# Patient Record
Sex: Male | Born: 1960 | Race: White | Hispanic: No | Marital: Married | Smoking: Current every day smoker
Health system: Southern US, Community
[De-identification: ages and names within clinical notes are randomized; demographics above are authoritative.]

## PROBLEM LIST (undated history)

## (undated) DIAGNOSIS — E669 Obesity, unspecified: Secondary | ICD-10-CM

## (undated) DIAGNOSIS — I509 Heart failure, unspecified: Secondary | ICD-10-CM

## (undated) DIAGNOSIS — I10 Essential (primary) hypertension: Secondary | ICD-10-CM

## (undated) DIAGNOSIS — E119 Type 2 diabetes mellitus without complications: Secondary | ICD-10-CM

## (undated) DIAGNOSIS — I4891 Unspecified atrial fibrillation: Secondary | ICD-10-CM

## (undated) DIAGNOSIS — B1920 Unspecified viral hepatitis C without hepatic coma: Secondary | ICD-10-CM

## (undated) DIAGNOSIS — N529 Male erectile dysfunction, unspecified: Secondary | ICD-10-CM

## (undated) DIAGNOSIS — I2510 Atherosclerotic heart disease of native coronary artery without angina pectoris: Secondary | ICD-10-CM

## (undated) HISTORY — DX: Unspecified atrial fibrillation: I48.91

## (undated) HISTORY — DX: Obesity, unspecified: E66.9

## (undated) HISTORY — DX: Heart failure, unspecified: I50.9

---

## 2004-12-25 ENCOUNTER — Inpatient Hospital Stay: Payer: Self-pay | Admitting: Internal Medicine

## 2005-01-26 ENCOUNTER — Ambulatory Visit: Payer: Self-pay | Admitting: Cardiology

## 2005-08-10 ENCOUNTER — Ambulatory Visit: Payer: Self-pay | Admitting: Internal Medicine

## 2005-08-10 ENCOUNTER — Other Ambulatory Visit: Payer: Self-pay

## 2005-09-15 ENCOUNTER — Inpatient Hospital Stay (HOSPITAL_COMMUNITY): Admission: AD | Admit: 2005-09-15 | Discharge: 2005-09-18 | Payer: Self-pay | Admitting: Internal Medicine

## 2005-09-15 ENCOUNTER — Ambulatory Visit: Payer: Self-pay | Admitting: Internal Medicine

## 2005-12-14 ENCOUNTER — Ambulatory Visit: Payer: Self-pay | Admitting: Internal Medicine

## 2006-11-22 ENCOUNTER — Ambulatory Visit: Payer: Self-pay | Admitting: Internal Medicine

## 2007-12-24 ENCOUNTER — Ambulatory Visit: Payer: Self-pay | Admitting: Internal Medicine

## 2008-07-31 ENCOUNTER — Ambulatory Visit: Payer: Self-pay | Admitting: Internal Medicine

## 2008-07-31 ENCOUNTER — Emergency Department: Payer: Self-pay | Admitting: Emergency Medicine

## 2008-10-09 ENCOUNTER — Ambulatory Visit: Payer: Self-pay | Admitting: Family Medicine

## 2009-05-25 DIAGNOSIS — E663 Overweight: Secondary | ICD-10-CM | POA: Insufficient documentation

## 2009-05-25 DIAGNOSIS — I4891 Unspecified atrial fibrillation: Secondary | ICD-10-CM

## 2010-03-24 ENCOUNTER — Encounter: Payer: Self-pay | Admitting: Internal Medicine

## 2010-05-12 ENCOUNTER — Encounter: Payer: Self-pay | Admitting: Cardiovascular Disease

## 2010-05-12 ENCOUNTER — Ambulatory Visit: Payer: Self-pay | Admitting: Internal Medicine

## 2010-05-12 DIAGNOSIS — I119 Hypertensive heart disease without heart failure: Secondary | ICD-10-CM | POA: Insufficient documentation

## 2010-05-12 DIAGNOSIS — E785 Hyperlipidemia, unspecified: Secondary | ICD-10-CM

## 2010-06-02 LAB — CONVERTED CEMR LAB
CO2: 24 meq/L (ref 19–32)
Chloride: 104 meq/L (ref 96–112)
HDL: 39 mg/dL — ABNORMAL LOW (ref >39)
LDL Cholesterol: 178 mg/dL — ABNORMAL HIGH (ref 0–99)
Potassium: 4.4 meq/L (ref 3.5–5.3)
TSH: 2.443 microintl units/mL (ref 0.350–4.500)
Total CHOL/HDL Ratio: 6.3
Triglycerides: 152 mg/dL — ABNORMAL HIGH (ref <150)
VLDL: 30 mg/dL (ref 0–40)

## 2010-11-02 ENCOUNTER — Ambulatory Visit: Payer: Self-pay | Admitting: Family Medicine

## 2011-01-17 NOTE — Letter (Signed)
Summary: Medical Record Release  Medical Record Release   Imported By: Harlon Flor 03/31/2010 09:21:30  _____________________________________________________________________  External Attachment:    Type:   Image     Comment:   External Document

## 2011-01-17 NOTE — Assessment & Plan Note (Signed)
Summary: F2Y/AMD  Medications Added ALDACTONE 25 MG TABS (SPIRONOLACTONE) Take 1 tablet by mouth once a day ASPIRIN EC 325 MG TBEC (ASPIRIN) Take one tablet by mouth daily      Allergies Added: ! SUDAFED  Visit Type:  Follow-up Primary Provider:  Dr Lorri Frederick  CC:  no complaints.  History of Present Illness: [name] is seen in followup for atrial fibrillation for which he previously took [employer] . this had worked for a long while but because of logistical issues he ran out of his prescription. He has not been taking any medicine for the last 3 months. He has had no intercurrent palpitations.It is his recollection that all of his atrial fibrillation is associated with Sudafed.  I don't have my records to confirm or deny that but I suspect that that is not true has otherwise we would not have put him on Tikosyn.  He also has a history of hypertension. He has lost 75 pounds and continues to make progress in this area. He has had sleep studies in the past which are negative.  The patient denies SOB, chest pain, edema or palpitations   Current Medications (verified): 1)  None  Allergies (verified): 1)  ! Sudafed  Vital Signs:  Patient profile:   50 year old male Height:      76 inches Weight:      304 pounds BMI:     37.14 Pulse rate:   84 / minute BP sitting:   137 / 91  (right arm) Cuff size:   large  Vitals Entered By: Hardin Negus, RMA (May 12, 2010 9:28 AM)  Physical Exam  General:  The patient was alert and oriented in no acute distress. He is morbidly obese HEENT Normal.  Neck veins were flat, carotids were brisk.  Lungs were clear.  Heart sounds were regular without murmurs or gallops.  Abdomen was soft with active bowel sounds. There is no clubbing cyanosis or edema. Skin Warm and dry    EKG  Procedure date:  05/12/2010  Findings:      sinus rhythm at 84 Intervals 0.14/0.08/23 5 Axis is 41  Impression & Recommendations:  Problem # 1:  ATRIAL  FIBRILLATION (ICD-427.31) The patient has had no recent atrial fibrillation. As such we will not resume his antiarrhythmic drug. He is reminded to stay off of stimulants.  As relates to therapy for his hypertension, a recent trial demonstrated a reduction in atrial fibrillation episodes the patient's randomized to Spirinolactone.  I have reviewed this with the patient we have also discussed the side effects including gynecomastia. Will begin him on therapy today. We will plan to check a metabolic profile in 2 weeks time as well as obtaining a baseline today to measure serum potassium. The following medications were removed from the medication list:    Tikosyn 250 Mcg Caps (Dofetilide) .Marland Kitchen... Take one capsule by mouth every 12 hours His updated medication list for this problem includes:    Aspirin Ec 325 Mg Tbec (Aspirin) .Marland Kitchen... Take one tablet by mouth daily  Orders: EKG w/ Interpretation (93000)  Problem # 2:  OVERWEIGHT/OBESITY (ICD-278.02)  he is doing well in this regard. We'll check hemoglobin A1c and a TSH.  Orders: T-Hgb A1C ([id number]) T-TSH [phone])  Problem # 3:  HYPERTENSION, HEART CONTROLLED W/O ASSOC CHF (ICD-402.10)  as noted above we will begin spironalctone therapy for hypertension and atrial fibrillation  His updated medication list for this problem includes:    Aldactone 25 Mg  Tabs (Spironolactone) .Marland Kitchen... Take 1 tablet by mouth once a day    Aspirin Ec 325 Mg Tbec (Aspirin) .Marland Kitchen... Take one tablet by mouth daily  Orders: T-Basic Metabolic Panel [phone]) T-TSH ([phone])  Problem # 4:  DYSLIPIDEMIA (ICD-272.4)  he was previously on statin therapy. We will recheck his FLP today and consider resumption. Hopefully his obesity reduction program blood had a significant enough impact on his lipids so as to avoid the need for further therapy  Orders: T-Lipid Profile ([id number]) T-Basic Metabolic Panel [phone])  Patient Instructions: 1)  Your  physician recommends that you return for lab work in: 2 weeks (BMP) 2)  Your physician has recommended you make the following change in your medication: Resume 325mg  Aspirin daily.  Start taking Aldactone 25mg  daily.   Prescriptions: ALDACTONE 25 MG TABS (SPIRONOLACTONE) Take 1 tablet by mouth once a day  #30 x 6   Entered by:   Cloyde Reams RN   Authorized by:   Nathen May, MD, [facility]   Signed by:   Cloyde Reams RN on 05/12/2010   Method used:   [phone][facility] (retail[facility]       [address]       [place], [place]  [address]       Ph: [phone] or [phone]       Fax: [phone]   RxID:   [id number]

## 2011-01-17 NOTE — Miscellaneous (Signed)
Summary: Orders Update  Clinical Lists Changes  Orders: Added new Test order of T-Basic Metabolic Panel [phone]) - Signed Added new Test order of T-Lipid Profile [phone]) - Signed Added new Test order of T-Hgb A1C ([id number]) - Signed Added new Test order of T-TSH ([id number]) - Signed

## 2011-05-02 NOTE — Letter (Signed)
December 24, 2007    Dr. Shelda Pal  [facility]  [address]  [place], [place] [address]   RE:  [name], [name]  MRN:  [id number]  /  DOB:  07/21/1961   Dear Dr. Lorri Frederick:   [name] continues to do well on Tikosyn for his atrial fibrillation.  In addition, he has lost 30 pounds and as he has done that, his blood  pressure has done pretty well.  At his medical weight loss program his  last recorded blood pressure was 122/75.  He has discontinued his  Avapro.  It sounds like there was a communication issue between our  office and the pharmacy but, in any case, he ended up stopping in  October and the aforementioned blood pressure is in the setting of no  drug therapy.  His other medications include the Tikosyn at 250 mcg,  Crestor and Coumadin.   On examination today his blood pressure is a little bit up at 130/88.  His weight was 334, down 32 pounds since last year.  His pulse is 84.  LUNGS:  Clear.  Heart sounds were regular.  EXTREMITIES:  Without edema.  SKIN:  Warm and dry.   IMPRESSION:  1. Paroxysmal atrial fibrillation, holding sinus rhythm on Tikosyn.  2. Borderline blood pressure.  3. Obesity with working on weight loss.   [name] is doing quite well.  We will continue him on his Tikosyn.  He probably needs a BMET at his next office visit to assure that his  potassium levels are appropriate and if you would not mind doing that  and faxing it to me, I would appreciate it.  I have encouraged him with  ongoing weight loss and also to keep track of his blood pressure,  recording these so that he can review them with you.   We will see him again in a year.    Sincerely,      Duke Salvia, MD, [facility]  Electronically Signed    SCK/MedQ  DD: 12/24/2007  DT: 12/24/2007  Job #: [id number]

## 2011-05-05 NOTE — Discharge Summary (Signed)
NAMEDISHON, KEHOE              ACCOUNT NO.:  [id number]   MEDICAL RECORD NO.:  [id number]          PATIENT TYPE:  INP   LOCATION:  2040                         FACILITY:  [facility]   PHYSICIAN:  Duke Salvia, M.D.  DATE OF BIRTH:  08/05/1961   DATE OF ADMISSION:  09/15/2005  DATE OF DISCHARGE:                                 DISCHARGE SUMMARY   ADDENDUM:  The addendum concerns beta blocker therapy.  He will go home on  atenolol 50 mg daily.  This is a new dose for him.  He is to try that for 15  days.  Then for the second trial, it will be metoprolol 50 mg one p.o.  b.i.d., once again this will be for 15 days.  Then the third trial will be  Inderal 80 mg one-half tablet in the morning, one-half tablet in the  evening, for a 15-day trial.  This addendum covers a change in the  medication therapy for [name].      Maple Mirza, P.A.    ______________________________  Duke Salvia, M.D.    GM/MEDQ  D:  09/18/2005  T:  09/19/2005  Job:  [id number]   cc:   [facility]  [address]  [place], [place]

## 2011-05-05 NOTE — Assessment & Plan Note (Signed)
[facility]                         ELECTROPHYSIOLOGY OFFICE NOTE   NAME:Moll, ISHAM SMITHERMAN                     MRN:          [id number]  DATE:11/22/2006                            DOB:          1961/06/29    Mr. Mazzoni is seen.  He has paroxysmal atrial fibrillation.  He is on  Tikosyn and holding.  His QTc today is 405.   His big issue is his weight.  He continues to cycle up and down, is now  at 365.  He recently went to go see [facility] [place] [facility] regarding  gastric banding, which I encouraged.   EXAMINATION:  His weight was as noted.  His blood pressure was 144/84,  pulse of 76.  LUNGS:  Clear.  HEART SOUNDS:  Regular.  EXTREMITIES:  Without edema.   Electrocardiogram demonstrated sinus rhythm with an isolated PVC with a  morphology consistent with origin in the RVOT.   IMPRESSION:  1. Paroxysmal atrial fibrillation, holding sinus rhythm on Tikosyn.  2. 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  3. Morbid obesity.   Mr. Sautter is doing quite well from a arrythmia point of view.  We had a  long discussion, more than 30 minutes, regarding weight, exercise and  such and will try to find a nutritionist for him.     Duke Salvia, MD, [facility]  Electronically Signed    SCK/MedQ  DD: 11/22/2006  DT: 11/22/2006  Job #: [id number]   cc:   Barry Brunner

## 2011-05-05 NOTE — Discharge Summary (Signed)
[name], [name]              ACCOUNT NO.:  [id number]   MEDICAL RECORD NO.:  [id number]          PATIENT TYPE:  INP   LOCATION:  2040                         FACILITY:  [facility]   PHYSICIAN:  Duke Salvia, M.D.  DATE OF BIRTH:  05-Nov-1961   DATE OF ADMISSION:  09/15/2005  DATE OF DISCHARGE:                                 DISCHARGE SUMMARY   She has no known drug allergies.   DISCHARGE DIAGNOSES:  1.  Paroxysmal atrial fibrillation, first diagnosed in 1999, then next      recurrence March 2006, then fairly persistent since then.  2.  Ibutilide-enhanced direct current cardioversion September 30 with      conversion to sinus rhythm, sinus rhythm then maintained until      discharge.  3.  Failed direct current cardioversion two to three weeks ago.  4.  QT prolongation on 500 mcg b.i.d. of Tikosyn.  This was reduced to 250      mg b.i.d. on September 30.  5.  His Toprol medication, which involved weight gain and fatigue, was      changed to atenolol this admission.  6.  Supratherapeutic INR, 3.5 on admission, subtherapeutic INR, 1.6, on      discharge.  Patient going home on Lovenox subcutaneously.   PLAN:  1.  Subcu Lovenox for at least Tuesday, October 3, and Wednesday, October 4.  2.  Patient presents to Coumadin clinic at [facility] October 4.  The      [facility] will call with that appointment.  3.  Exercise treadmill study at [facility] (assess chronotropic      response), [facility] to set up for two weeks after discharge.  4.  Patient will see Dr. Sherryl Manges in four weeks and then again in six      months.   SECONDARY DIAGNOSES:  1.  Hypertension.  2.  Morbid obesity.  3.  Variable bundle branch block.   DISCHARGE DISPOSITION:  [name] is discharging October 2.  He has  completed Tikosyn loading therapy.  His dose has been adjusted in accordance  with the prolongation of his QT.  His Coumadin has been monitored throughout  this  hospitalization, but it showed a drop after correcting for  supratherapeutic INR on admission.  The patient has been supported on  Lovenox therapy when the INR dipped below 2.0.  the patient is maintaining  sinus rhythm ever since cardioversion.  His blood pressure is 105/62 at the  time of discharge.  Oxygen saturation is 96% on room air.  PT on October 2  was 19.4, INR 1.6.   The patient goes home on the following medications:  1.  Lovenox injections 170 mg subcu in the morning, 170 mg subcu in the      evening, at least Tuesday and Wednesday.  2.  Tikosyn 250 mcg twice daily, 7 a.m. and 7 p.m.  This is a new      medication.  3.  Atenolol 100 mg daily, new medication.  4.  Diltiazem 360 mg daily.  5.  Crestor 20 mg  daily at bedtime.  6.  Coumadin, resume doses of 9 mg daily as before this admission (or as      directed by Coumadin clinic).   FOLLOW-UP:  1.  In Coumadin clinic at [facility] Wednesday, October 4.  Their      office will call with appointment.  2.  Exercise treadmill study, [facility], after two weeks.  3.  To see Dr. Graciela Husbands in four weeks and then again in six moths.  Dr. Odessa Fleming      office will call with that appointment.   BRIEF HISTORY:  [name] is a 50 year old gentleman.  He has a history of  atrial fibrillation that was first diagnosed in 1999 and converted  spontaneously.  The first part of this year he had recurrences.  He noted  palpitations.  He was found to be in atrial fibrillation, rapid ventricular  response.  Cardioversion studies were unsuccessful.  The patient has  enlarged left atrium, about 5.8 cm, and also has left ventricular  hypertrophy.  The patient has significant exercise intolerance and some  peripheral edema.  The plan for [name] will be ibutilide-assisted direct  current cardioversion and the beginning of Tikosyn to facilitate this  cardioversion.  The patient will be admitted electively on September 29 for  a rather  complete course of treatment.   HOSPITAL COURSE:  The patient presented September 29.  He underwent  ibutilide-assisted cardioversion with conversion to sinus rhythm.  The  patient had been started on Tikosyn the day before and it was early on noted  that the QT had prolonged on 500 mcg b.i.d.  The patient's dose was then  reduced to 250 mg b.i.d. and the patient has done well on this reduced dose.  After cardioversion, the patient has maintained sinus rhythm over a 48-hour  period.  The patient discharging October 2 well into his 72 hours of sinus  rhythm with the medications and follow-up as dictated.      Maple Mirza, P.A.    ______________________________  Duke Salvia, M.D.    GM/MEDQ  D:  09/18/2005  T:  09/19/2005  Job:  [id number]   cc:   Rudi Coco, N.P.  [facility]  [address]  [place], [place]

## 2011-05-05 NOTE — Op Note (Signed)
NAMEYOSHITO, GAZA              ACCOUNT NO.:  [id number]   MEDICAL RECORD NO.:  [id number]          PATIENT TYPE:  OIB   LOCATION:  2040                         FACILITY:  [facility]   PHYSICIAN:  Doylene Canning. Ladona Ridgel, M.D.  DATE OF BIRTH:  09/06/1961   DATE OF PROCEDURE:  09/15/2005  DATE OF DISCHARGE:                                 OPERATIVE REPORT   ELECTROPHYSIOLOGIC PROCEDURE   PROCEDURE PERFORMED:  Elective DC cardioversion.   INDICATIONS:  Symptomatic atrial fibrillation.   I. INTRODUCTION:  The patient is a 50 year old obese man with a history of  hypertension and a remote history of atrial fibrillation several years ago.  The patient approximately 7 months ago went back in atrial fibrillation  while taking some medication for his upper respiratory tract. He underwent  attempted DC cardioversion several weeks ago which was unsuccessful. He has  been therapeutically anticoagulated and is now referred today for repeat DC  cardioversion utilizing ibutilide and necessary internal DC cardioversion.   II. PROCEDURE:  After informed consent was obtained, the patient was taken  to the diagnostic EP lab in the fasting state. After the  usual preparation  he was placed in the supine position; 1 mg of ibutilide was infused over 10  minutes. The patient was sedated with fentanyl and Versed. He was  successfully cardioverted back to sinus rhythm with 300 joules of  synchronized biphasic energy. This resulted in restoration of sinus rhythm.  He was returned to his room in satisfactory condition.   III. COMPLICATIONS:  There were no immediate procedure complications.   IV. RESULTS:  This demonstrate successful DC cardioversion in a patient with  persistent symptomatic atrial fibrillation.           ______________________________  Doylene Canning. Ladona Ridgel, M.D.     GWT/MEDQ  D:  09/15/2005  T:  09/15/2005  Job:  [id number]   cc:   Arnoldo Hooker, M.D.  [place]  [place]   Duke Salvia, M.D.  [address]  [address]  [place]  [place] [address]

## 2011-06-08 ENCOUNTER — Encounter: Payer: Self-pay | Admitting: Cardiovascular Disease

## 2011-07-21 ENCOUNTER — Encounter: Payer: Self-pay | Admitting: Internal Medicine

## 2014-12-18 DIAGNOSIS — I2510 Atherosclerotic heart disease of native coronary artery without angina pectoris: Secondary | ICD-10-CM

## 2014-12-18 HISTORY — DX: Atherosclerotic heart disease of native coronary artery without angina pectoris: I25.10

## 2014-12-18 HISTORY — PX: CORONARY STENT PLACEMENT: SHX1402

## 2015-07-11 ENCOUNTER — Ambulatory Visit
Admission: EM | Admit: 2015-07-11 | Discharge: 2015-07-11 | Disposition: A | Discharge status: Home / Self Care | Payer: BLUE CROSS/BLUE SHIELD | Attending: Internal Medicine | Admitting: Internal Medicine

## 2015-07-11 ENCOUNTER — Encounter: Payer: Self-pay | Admitting: *Deleted

## 2015-07-11 DIAGNOSIS — K047 Periapical abscess without sinus: Secondary | ICD-10-CM | POA: Diagnosis not present

## 2015-07-11 DIAGNOSIS — K140 Glossitis: Secondary | ICD-10-CM | POA: Diagnosis not present

## 2015-07-11 MED ORDER — CEFTRIAXONE SODIUM 1 G IJ SOLR
1.0000 g | Freq: Once | INTRAMUSCULAR | Status: AC
  Administered 2015-07-11: 1 g via INTRAMUSCULAR

## 2015-07-11 MED ORDER — KETOROLAC TROMETHAMINE 60 MG/2ML IM SOLN
60.0000 mg | Freq: Once | INTRAMUSCULAR | Status: AC
  Administered 2015-07-11: 60 mg via INTRAMUSCULAR

## 2015-07-11 MED ORDER — AMOXICILLIN-POT CLAVULANATE 875-125 MG PO TABS: 1.0000 | ORAL_TABLET | Freq: Two times a day (BID) | ORAL | Status: DC

## 2015-07-11 NOTE — ED Provider Notes (Signed)
CSN: [id number]     Arrival date & time 07/11/15  1404 History   First MD Initiated Contact with Patient 07/11/15 1449     Chief Complaint  Patient presents with  . Dental Pain   HPI  Patient is a 54 year old gentleman who presents today with a 2 day history of intermittent discomfort around a right lower bicuspid. This became severe last evening, he was up all night with pain, and had significant swelling this morning in the floor of his mouth, under the tongue, when he woke up. He had if occult the opening his mouth due to pain as well. 800 mg of ibuprofen helped a lot with pain, and now he is able to open his mouth. No fever, no malaise.   Past Medical History  Diagnosis Date  . Atrial fibrillation   . 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 Zachery Conch at [facility]      Hyperlipidemia, not currently under treatment      Hypertension, not currently being treated History reviewed. No pertinent past surgical history. Family history: Father has dementia; other family history includes colorectal cancer, prostate cancer, cardiac disease  History  Substance Use Topics  . Smoking status: Current Every Day Smoker  . Smokeless tobacco: Not on file  . 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 Father was recently placed in a nursing home and is having a difficult time.  Review of Systems  All other systems reviewed and are negative.   Allergies  Pseudoephedrine  causes atrial fibrillation  Home Medications   Prior to Admission medications   Medication Sig Start Date End Date Taking? Authorizing Provider  ibuprofen (ADVIL,MOTRIN) 800 MG tablet Take 800 mg by mouth every 8 (eight) hours as needed.    Yes Historical Provider, MD  BP 156/93 mmHg  Pulse 98  Temp(Src) 98.6 F (37 C) (Oral)  Ht 6\' 4"  (1.93 m)  Wt 307 lb (139.254 kg)  BMI  37.38 kg/m2  SpO2 95% Physical Exam  Constitutional: He is oriented to person, place, and time. No distress.  Alert, nicely groomed  HENT:  Head: Atraumatic.  Mouth/Throat:    Marked tender swelling under the tongue Mild trismus, patient says this has improved dramatically after taking 800 mg of ibuprofen earlier today Several absent teeth in the lower right jaw, infection around a right lower bicuspid  Eyes:  Conjugate gaze, no eye redness/drainage  Neck: Neck supple.  Cardiovascular: Normal rate.   Pulmonary/Chest: No respiratory distress.  Abdominal: Soft. He exhibits no distension.  Musculoskeletal: Normal range of motion.  Neurological: He is alert and oriented to person, place, and time.  Skin: Skin is warm and dry.  No cyanosis  Nursing note and vitals reviewed.   ED Course  Procedures Medications  cefTRIAXone (ROCEPHIN) injection 1 g (1 g Intramuscular Given 07/11/15 1500)  ketorolac (TORADOL) injection 60 mg (60 mg Intramuscular Given 07/11/15 1501)    MDM   1. Dental abscess   2. Sublingual infection    Rx augmentin sent to pharmacy.  FU dentist asap. To ER for new fever >100.5, worsening swelling in floor of mouth/under tongue.    Eustace Moore, MD 07/11/15 [phone]

## 2015-07-11 NOTE — Discharge Instructions (Signed)
Prescription for augmentin ([facility]oxicillin[facility]clavul[facility]ate[facility] [place] Followup with dentist as soon as possible. Go to ER for new fever >100.5, worsening swelling under tongue/in floor of mouth.  Abscessed Tooth An abscessed tooth is an infection around your tooth. It may be caused by holes or damage to the tooth (cavity) or a dental disease. An abscessed tooth causes mild to very bad pain in and around the tooth. See your dentist right away if you have tooth or gum pain. HOME CARE  Take your medicine as told. Finish it even if you start to feel better.  Do not drive after taking pain medicine.  Rinse your mouth (gargle) often with salt water ( teaspoon salt in 8 ounces of warm water).  Do not apply heat to the outside of your face. GET HELP RIGHT AWAY IF:   You have a temperature by mouth above 102 F (38.9 C), not controlled by medicine.  You have chills and a very bad headache.  You have problems breathing or swallowing.  Your mouth will not open.  You develop puffiness (swelling) on the neck or around the eye.  Your pain is not helped by medicine.  Your pain is getting worse instead of better. MAKE SURE YOU:   Understand these instructions.  Will watch your condition.  Will get help right away if you are not doing well or get worse. Document Released: 05/22/2008 Document Revised: 02/26/2012 Document Reviewed: 03/14/2011 [facility] Patient Information 2015 [place], [place]. This information is not intended to replace advice given to you by your health care provider. Make sure you discuss any questions you have with your health care provider.

## 2015-07-11 NOTE — ED Notes (Signed)
Pt states that he has had an abscessed tooth before, would like abx while waiting for dental appt

## 2015-07-17 ENCOUNTER — Ambulatory Visit
Admission: EM | Admit: 2015-07-17 | Discharge: 2015-07-17 | Payer: BLUE CROSS/BLUE SHIELD | Attending: Emergency Medicine | Admitting: Emergency Medicine

## 2015-07-17 ENCOUNTER — Other Ambulatory Visit: Payer: Self-pay

## 2015-07-17 ENCOUNTER — Encounter: Payer: Self-pay | Admitting: *Deleted

## 2015-07-17 DIAGNOSIS — I213 ST elevation (STEMI) myocardial infarction of unspecified site: Secondary | ICD-10-CM | POA: Diagnosis not present

## 2015-07-17 DIAGNOSIS — R9431 Abnormal electrocardiogram [ECG] [EKG]: Secondary | ICD-10-CM | POA: Insufficient documentation

## 2015-07-17 DIAGNOSIS — I209 Angina pectoris, unspecified: Secondary | ICD-10-CM | POA: Diagnosis not present

## 2015-07-17 DIAGNOSIS — R079 Chest pain, unspecified: Secondary | ICD-10-CM | POA: Diagnosis present

## 2015-07-17 MED ORDER — ASPIRIN 81 MG PO CHEW
324.0000 mg | CHEWABLE_TABLET | Freq: Once | ORAL | Status: AC
  Administered 2015-07-17: 324 mg via ORAL

## 2015-07-17 NOTE — ED Notes (Signed)
Pt states that he was here last week with an abscessed tooth, was given antibiotics, since then he is now having chest pains and coughing up mucus, and was running a fever on Tuesday night.

## 2015-07-17 NOTE — ED Provider Notes (Addendum)
HPI  SUBJECTIVE:  [name] is a 54 y.o. male who presents with left-sided chest pain described as sharp, constant starting approximately 2 and half hours ago. He states that it radiates to the shoulder. He states that is better with rest, sitting up, worse with coughing. He tried Tylenol for this this morning. He started having the chest pain approximately 2 days ago, with intermittent up until today. He has had a cough for 3 days. No nausea, vomiting, diaphoresis. No radiation of pain to the neck or down his arm. No fevers, wheezing, shortness of breath. No syncope. Patient has past medical history of hypertension, atrial fibrillation, abnormal stress test with a resulting cardiac catheterization approximately 10 years ago. Patient does not remember the results of the cardiac catheterization. He is a smoker. He has no history of MI, CHF.. Family history negative for early MI.   Past Medical History  Diagnosis Date  . Atrial fibrillation   . Obesity     History reviewed. No pertinent past surgical history.  No family history on file.  History  Substance Use Topics  . Smoking status: Current Every Day Smoker  . Smokeless tobacco: Not on file  . Alcohol Use: No    No current facility-administered medications for this encounter.  Current outpatient prescriptions:  .  amoxicillin-clavulanate (AUGMENTIN) 875-125 MG per tablet, Take 1 tablet by mouth every 12 (twelve) hours., Disp: 20 tablet, Rfl: 0 .  ibuprofen (ADVIL,MOTRIN) 800 MG tablet, Take 800 mg by mouth every 8 (eight) hours as needed., Disp: , Rfl:   Allergies  Allergen Reactions  . Pseudoephedrine     REACTION: ? starts him in A fib     ROS  As noted in HPI.   Physical Exam  BP 147/88 mmHg  Pulse 69  Temp(Src) 97.5 F (36.4 C) (Oral)  Ht 6\' 4"  (1.93 m)  Wt 307 lb (139.254 kg)  BMI 37.38 kg/m2  SpO2 95%  Constitutional: Well developed, well nourished, no acute distress Eyes: PERRL, EOMI, conjunctiva  normal bilaterally HENT: Normocephalic, atraumatic,mucus membranes moist Respiratory: Good air movement, rhonchi left lower lobe. No wheezing. Cardiovascular: Normal rate and rhythm, no murmurs, no gallops, no rubs GI: Soft, nondistended, normal bowel sounds, nontender, no rebound, no guarding Back: no CVAT skin: No rash, skin intact Musculoskeletal: No edema, no tenderness, no deformities Neurologic: Alert & oriented x 3, CN II-XII grossly intact, no motor deficits, sensation grossly intact Psychiatric: Speech and behavior appropriate   ED Course   Medications  aspirin chewable tablet 324 mg (324 mg Oral Given 07/17/15 1430)    Orders Placed This Encounter  Procedures  . ED EKG    Standing Status: Standing     Number of Occurrences: 1     Standing Expiration Date:     Order Specific Question:  Reason for Exam    Answer:  Chest Pain   No results found for this or any previous visit (from the past 24 hour(s)). No results found.  ED Clinical Impression  ST elevation myocardial infarction (STEMI), unspecified artery  Ischemic chest pain  Abnormal EKG   ED Assessment/Plan EKG: Normal sinus rhythm, rate 65 normal axis, normal intervals. 85mm ST elevation in III,  1 mm aVF, with ST depression in aVL concerning for inferior ischemia. There is no previous EKG for comparison. EKG #2, no change.  Gave the patient 324 mg aspirin. Called ems, transporting to the [facility] ED.   *This clinic note was created using Dragon dictation software.  Therefore, there may be occasional mistakes despite careful proofreading.  ?   Domenick Gong, MD 07/17/15 1442  Domenick Gong, MD 07/17/15 [phone]

## 2016-02-12 ENCOUNTER — Encounter: Payer: Self-pay | Admitting: Emergency Medicine

## 2016-02-12 ENCOUNTER — Emergency Department
Admission: EM | Admit: 2016-02-12 | Discharge: 2016-02-12 | Disposition: A | Discharge status: Home / Self Care | Payer: BLUE CROSS/BLUE SHIELD | Attending: Emergency Medicine | Admitting: Emergency Medicine

## 2016-02-12 DIAGNOSIS — K0499 Other diseases of pulp and periapical tissues: Secondary | ICD-10-CM | POA: Insufficient documentation

## 2016-02-12 DIAGNOSIS — F17200 Nicotine dependence, unspecified, uncomplicated: Secondary | ICD-10-CM | POA: Insufficient documentation

## 2016-02-12 DIAGNOSIS — K08409 Partial loss of teeth, unspecified cause, unspecified class: Secondary | ICD-10-CM | POA: Diagnosis not present

## 2016-02-12 DIAGNOSIS — I10 Essential (primary) hypertension: Secondary | ICD-10-CM | POA: Diagnosis not present

## 2016-02-12 DIAGNOSIS — K0889 Other specified disorders of teeth and supporting structures: Secondary | ICD-10-CM | POA: Diagnosis present

## 2016-02-12 MED ORDER — TRAMADOL HCL 50 MG PO TABS: 50.0000 mg | ORAL_TABLET | Freq: Four times a day (QID) | ORAL | Status: AC | PRN

## 2016-02-12 MED ORDER — AMOXICILLIN 500 MG PO CAPS: 500.0000 mg | ORAL_CAPSULE | Freq: Three times a day (TID) | ORAL | Status: DC

## 2016-02-12 NOTE — ED Provider Notes (Signed)
[facility] Emergency Department Provider Note  ____________________________________________  Time seen: Approximately 5:03 PM  I have reviewed the triage vital signs and the nursing notes.   HISTORY  Chief Complaint Dental Injury    HPI [name] is a 55 y.o. male patient complaining of dental pain and right lower molar for 4 days. Patient stated problems with this tooth in the past and is having it extracted in 2 weeks.Currently is having some gingival edema and pain to Tooth  #27. No palliative measures taken for this complaint. Patient rated his pain as a 6/10. Patient describes pain as sharp.   Past Medical History  Diagnosis Date  . Atrial fibrillation (HCC)   . Obesity     Patient Active Problem List   Diagnosis Date Noted  . DYSLIPIDEMIA 05/12/2010  . HYPERTENSION, HEART CONTROLLED W/O ASSOC CHF 05/12/2010  . OVERWEIGHT/OBESITY 05/25/2009  . ATRIAL FIBRILLATION 05/25/2009    History reviewed. No pertinent past surgical history.  Current Outpatient Rx  Name  Route  Sig  Dispense  Refill  . amoxicillin (AMOXIL) 500 MG capsule   Oral   Take 1 capsule (500 mg total) by mouth 3 (three) times daily.   30 capsule   0   . amoxicillin-clavulanate (AUGMENTIN) 875-125 MG per tablet   Oral   Take 1 tablet by mouth every 12 (twelve) hours.   20 tablet   0   . ibuprofen (ADVIL,MOTRIN) 800 MG tablet   Oral   Take 800 mg by mouth every 8 (eight) hours as needed.         . traMADol (ULTRAM) 50 MG tablet   Oral   Take 1 tablet (50 mg total) by mouth every 6 (six) hours as needed.   20 tablet   0     Allergies Pseudoephedrine  No family history on file.  Social History Social History  Substance Use Topics  . Smoking status: Current Every Day Smoker  . Smokeless tobacco: None  . Alcohol Use: No    Review of Systems Constitutional: No fever/chills Eyes: No visual changes. ENT: No sore throat. Dental pain Cardiovascular:  Denies chest pain. Respiratory: Denies shortness of breath. Gastrointestinal: No abdominal pain.  No nausea, no vomiting.  No diarrhea.  No constipation. Genitourinary: Negative for dysuria. Musculoskeletal: Negative for back pain. Skin: Negative for rash. Neurological: Negative for headaches, focal weakness or numbness. Allergic/Immunilogical: Sudafed  10-point ROS otherwise negative.  ____________________________________________   PHYSICAL EXAM:  VITAL SIGNS: ED Triage Vitals  Enc Vitals Group     BP 02/12/16 1651 155/79 mmHg     Pulse Rate 02/12/16 1651 78     Resp 02/12/16 1651 18     Temp 02/12/16 1651 98.6 F (37 C)     Temp Source 02/12/16 1651 Oral     SpO2 02/12/16 1651 96 %     Weight 02/12/16 1651 307 lb (139.254 kg)     Height 02/12/16 1651 6\' 1"  (1.854 m)     Head Cir --      Peak Flow --      Pain Score 02/12/16 1652 6     Pain Loc --      Pain Edu? --      Excl. in GC? --     Constitutional: Alert and oriented. Well appearing and in no acute distress. Morbid obesity Eyes: Conjunctivae are normal. PERRL. EOMI. Head: Atraumatic. Nose: No congestion/rhinnorhea. Mouth/Throat: Mucous membranes are moist.  Oropharynx non-erythematous. Devitalized tooth #27  with mild gingival edema. No abscess. Neck: No stridor.  No cervical spine tenderness to palpation. Hematological/Lymphatic/Immunilogical: No cervical lymphadenopathy. Cardiovascular: Normal rate, regular rhythm. Grossly normal heart sounds.  Good peripheral circulation. Elevated blood pressure Respiratory: Normal respiratory effort.  No retractions. Lungs CTAB. Gastrointestinal: Soft and nontender. No distention. No abdominal bruits. No CVA tenderness. Musculoskeletal: No lower extremity tenderness nor edema.  No joint effusions. Neurologic:  Normal speech and language. No gross focal neurologic deficits are appreciated. No gait instability. Skin:  Skin is warm, dry and intact. No rash noted. Psychiatric:  Mood and affect are normal. Speech and behavior are normal.  ____________________________________________   LABS (all labs ordered are listed, but only abnormal results are displayed)  Labs Reviewed - No data to display ____________________________________________  EKG   ____________________________________________  RADIOLOGY   ____________________________________________   PROCEDURES  Procedure(s) performed: None  Critical Care performed: No  ____________________________________________   INITIAL IMPRESSION / ASSESSMENT AND PLAN / ED COURSE  Pertinent labs & imaging results that were available during my care of the patient were reviewed by me and considered in my medical decision making (see chart for details).  Dental pain secondary devitalized tooth. Patient given a prescription for amoxicillin and tramadol. Patient advised to follow-up with scheduled dental appointment. ____________________________________________   FINAL CLINICAL IMPRESSION(S) / ED DIAGNOSES  Final diagnoses:  Pain, dental      Joni Reining, PA-C 02/12/16 1715  Jennye Moccasin, MD 02/12/16 [phone]

## 2016-02-12 NOTE — ED Notes (Signed)
C/o toothache.  NAD

## 2016-02-12 NOTE — Discharge Instructions (Signed)
Follow-up for scheduled dental appointment. 

## 2018-06-26 ENCOUNTER — Emergency Department
Admission: EM | Admit: 2018-06-26 | Discharge: 2018-06-26 | Disposition: A | Discharge status: Home / Self Care | Payer: BLUE CROSS/BLUE SHIELD | Attending: Emergency Medicine | Admitting: Emergency Medicine

## 2018-06-26 ENCOUNTER — Other Ambulatory Visit: Payer: Self-pay

## 2018-06-26 ENCOUNTER — Emergency Department: Payer: BLUE CROSS/BLUE SHIELD

## 2018-06-26 DIAGNOSIS — I10 Essential (primary) hypertension: Secondary | ICD-10-CM | POA: Diagnosis not present

## 2018-06-26 DIAGNOSIS — R000 Tachycardia, unspecified: Secondary | ICD-10-CM | POA: Diagnosis not present

## 2018-06-26 DIAGNOSIS — K529 Noninfective gastroenteritis and colitis, unspecified: Secondary | ICD-10-CM | POA: Diagnosis not present

## 2018-06-26 DIAGNOSIS — E119 Type 2 diabetes mellitus without complications: Secondary | ICD-10-CM | POA: Insufficient documentation

## 2018-06-26 DIAGNOSIS — R1013 Epigastric pain: Secondary | ICD-10-CM | POA: Diagnosis present

## 2018-06-26 DIAGNOSIS — F17200 Nicotine dependence, unspecified, uncomplicated: Secondary | ICD-10-CM | POA: Insufficient documentation

## 2018-06-26 HISTORY — DX: Type 2 diabetes mellitus without complications: E11.9

## 2018-06-26 HISTORY — DX: Unspecified viral hepatitis C without hepatic coma: B19.20

## 2018-06-26 LAB — CBC
HEMATOCRIT: 51.3 % (ref 40.0–52.0)
Hemoglobin: 17.8 g/dL (ref 13.0–18.0)
MCH: 31.3 pg (ref 26.0–34.0)
MCHC: 34.8 g/dL (ref 32.0–36.0)
MCV: 90.1 fL (ref 80.0–100.0)
PLATELETS: 129 10*3/uL — AB (ref 150–440)
RBC: 5.69 MIL/uL (ref 4.40–5.90)
RDW: 12.9 % (ref 11.5–14.5)
WBC: 12.9 10*3/uL — ABNORMAL HIGH (ref 3.8–10.6)

## 2018-06-26 LAB — URINALYSIS, COMPLETE (UACMP) WITH MICROSCOPIC
Bilirubin Urine: NEGATIVE
Glucose, UA: NEGATIVE mg/dL
Ketones, ur: 5 mg/dL — AB
Leukocytes, UA: NEGATIVE
Nitrite: NEGATIVE
Protein, ur: 30 mg/dL — AB
Specific Gravity, Urine: 1.032 — ABNORMAL HIGH (ref 1.005–1.030)
pH: 5 (ref 5.0–8.0)

## 2018-06-26 LAB — COMPREHENSIVE METABOLIC PANEL
ALBUMIN: 4.4 g/dL (ref 3.5–5.0)
ALK PHOS: 49 U/L (ref 38–126)
ALT: 36 U/L (ref 0–44)
AST: 27 U/L (ref 15–41)
Anion gap: 7 (ref 5–15)
BILIRUBIN TOTAL: 1.4 mg/dL — AB (ref 0.3–1.2)
BUN: 12 mg/dL (ref 6–20)
CALCIUM: 8.9 mg/dL (ref 8.9–10.3)
CO2: 23 mmol/L (ref 22–32)
Chloride: 108 mmol/L (ref 98–111)
Creatinine, Ser: 0.54 mg/dL — ABNORMAL LOW (ref 0.61–1.24)
GFR calc Af Amer: >60 mL/min (ref >60)
GFR calc non Af Amer: >60 mL/min (ref >60)
GLUCOSE: 147 mg/dL — AB (ref 70–99)
Potassium: 3.8 mmol/L (ref 3.5–5.1)
SODIUM: 138 mmol/L (ref 135–145)
Total Protein: 8 g/dL (ref 6.5–8.1)

## 2018-06-26 LAB — LIPASE, BLOOD: Lipase: 33 U/L (ref 11–51)

## 2018-06-26 MED ORDER — CIPROFLOXACIN HCL 500 MG PO TABS: 500.0000 mg | ORAL_TABLET | Freq: Two times a day (BID) | ORAL | 0 refills | Status: AC

## 2018-06-26 MED ORDER — ONDANSETRON HCL 4 MG/2ML IJ SOLN
4.0000 mg | Freq: Once | INTRAMUSCULAR | Status: AC
  Administered 2018-06-26: 4 mg via INTRAVENOUS

## 2018-06-26 MED ORDER — LOPERAMIDE HCL 2 MG PO CAPS
ORAL_CAPSULE | ORAL | Status: AC
  Administered 2018-06-26: 4 mg via ORAL
  Filled 2018-06-26: qty 2

## 2018-06-26 MED ORDER — ONDANSETRON 4 MG PO TBDP: 4.0000 mg | ORAL_TABLET | Freq: Three times a day (TID) | ORAL | 0 refills | Status: DC | PRN

## 2018-06-26 MED ORDER — IOHEXOL 300 MG/ML  SOLN
100.0000 mL | Freq: Once | INTRAMUSCULAR | Status: AC | PRN
  Administered 2018-06-26: 100 mL via INTRAVENOUS
  Filled 2018-06-26: qty 100

## 2018-06-26 MED ORDER — MORPHINE SULFATE (PF) 4 MG/ML IV SOLN
4.0000 mg | Freq: Once | INTRAVENOUS | Status: AC
  Administered 2018-06-26: 4 mg via INTRAVENOUS
  Filled 2018-06-26: qty 1

## 2018-06-26 MED ORDER — ONDANSETRON HCL 4 MG/2ML IJ SOLN
INTRAMUSCULAR | Status: AC
  Filled 2018-06-26: qty 2

## 2018-06-26 MED ORDER — HYDROCODONE-ACETAMINOPHEN 5-325 MG PO TABS: 1.0000 | ORAL_TABLET | Freq: Four times a day (QID) | ORAL | 0 refills | Status: DC | PRN

## 2018-06-26 MED ORDER — LOPERAMIDE HCL 2 MG PO CAPS
4.0000 mg | ORAL_CAPSULE | Freq: Once | ORAL | Status: AC
  Administered 2018-06-26: 4 mg via ORAL

## 2018-06-26 NOTE — ED Triage Notes (Signed)
Mid lower abdominal pain under the umbilicus x 2 days. Diarrhea. Pain X 2 days, decreased appetite. Pt alert and oriented X4, active, cooperative, pt in NAD. RR even and unlabored, color WNL.

## 2018-06-26 NOTE — ED Provider Notes (Signed)
[facility] Emergency Department Provider Note  ____________________________________________   First MD Initiated Contact with Patient 06/26/18 1233     (approximate)  I have reviewed the triage vital signs and the nursing notes.   HISTORY  Chief Complaint Abdominal Pain   HPI [name] is a 57 y.o. male who self presents to the emergency department with epigastric and umbilical pain for the past 2 days associated with loose stools.  He reports some nausea but no vomiting.   His last antibiotic use was about 3 months ago and was amoxicillin.  His stools are loose and several a day.  No fevers or chills.  No history of abdominal surgeries.  No recent travel outside the country.  His pain is intermittent cramping.  Nothing seems to make it better or worse.  His pain is not ripping or tearing and does not go straight to his back.   Past Medical History:  Diagnosis Date  . Atrial fibrillation (HCC)   . Atrial fibrillation (HCC)   . Diabetes mellitus without complication (HCC)   . Hepatitis C   . Obesity     Patient Active Problem List   Diagnosis Date Noted  . DYSLIPIDEMIA 05/12/2010  . HYPERTENSION, HEART CONTROLLED W/O ASSOC CHF 05/12/2010  . OVERWEIGHT/OBESITY 05/25/2009  . ATRIAL FIBRILLATION 05/25/2009    History reviewed. No pertinent surgical history.  Prior to Admission medications   Medication Sig Start Date End Date Taking? Authorizing Provider  amoxicillin (AMOXIL) 500 MG capsule Take 1 capsule (500 mg total) by mouth 3 (three) times daily. 02/12/16   Joni Reining, PA-C  amoxicillin-clavulanate (AUGMENTIN) 875-125 MG per tablet Take 1 tablet by mouth every 12 (twelve) hours. 07/11/15   Isa Rankin, MD  ciprofloxacin (CIPRO) 500 MG tablet Take 1 tablet (500 mg total) by mouth 2 (two) times daily for 3 days. 06/26/18 06/29/18  Merrily Brittle, MD  HYDROcodone-acetaminophen (NORCO) 5-325 MG tablet Take 1 tablet by mouth every 6  (six) hours as needed for up to 7 doses for severe pain. 06/26/18   Merrily Brittle, MD  ibuprofen (ADVIL,MOTRIN) 800 MG tablet Take 800 mg by mouth every 8 (eight) hours as needed.    [provider]  ondansetron (ZOFRAN ODT) 4 MG disintegrating tablet Take 1 tablet (4 mg total) by mouth every 8 (eight) hours as needed for nausea or vomiting. 06/26/18   Merrily Brittle, MD    Allergies Pseudoephedrine  No family history on file.  Social History Social History   Tobacco Use  . Smoking status: Current Every Day Smoker  Substance Use Topics  . Alcohol use: No  . Drug use: Not on file    Review of Systems Constitutional: No fever/chills Eyes: No visual changes. ENT: No sore throat. Cardiovascular: Denies chest pain. Respiratory: Denies shortness of breath. Gastrointestinal: Positive for abdominal pain.  Positive for nausea, no vomiting.  Positive for diarrhea.  No constipation. Genitourinary: Negative for dysuria. Musculoskeletal: Negative for back pain. Skin: Negative for rash. Neurological: Negative for headaches, focal weakness or numbness.   ____________________________________________   PHYSICAL EXAM:  VITAL SIGNS: ED Triage Vitals  Enc Vitals Group     BP 06/26/18 1055 118/79     Pulse Rate 06/26/18 1055 (!) 101     Resp 06/26/18 1055 16     Temp 06/26/18 1055 98.3 F (36.8 C)     Temp Source 06/26/18 1055 Oral     SpO2 06/26/18 1055 93 %  Weight 06/26/18 1056 249 lb (112.9 kg)     Height 06/26/18 1056 6\' 2"  (1.88 m)     Head Circumference --      Peak Flow --      Pain Score 06/26/18 1056 5     Pain Loc --      Pain Edu? --      Excl. in GC? --     Constitutional: Alert and oriented x4 obviously uncomfortable appearing holding his abdomen nontoxic no diaphoresis speaks full clear sentences Eyes: PERRL EOMI. Head: Atraumatic. Nose: No congestion/rhinnorhea. Mouth/Throat: No trismus Neck: No stridor.   Cardiovascular: Tachycardic rate,  regular rhythm. Grossly normal heart sounds.  Good peripheral circulation. Respiratory: Normal respiratory effort.  No retractions. Lungs CTAB and moving good air Gastrointestinal: Soft abdomen diffuse tenderness with no focality no rebound or guarding no peritonitis Musculoskeletal: No lower extremity edema   Neurologic:  Normal speech and language. No gross focal neurologic deficits are appreciated. Skin:  Skin is warm, dry and intact. No rash noted. Psychiatric: Mood and affect are normal. Speech and behavior are normal.    ____________________________________________   DIFFERENTIAL includes but not limited to  Appendicitis, diverticulitis, bowel obstruction, pyelonephritis, nephrolithiasis, C. difficile ____________________________________________   LABS (all labs ordered are listed, but only abnormal results are displayed)  Labs Reviewed  COMPREHENSIVE METABOLIC PANEL - Abnormal; Notable for the following components:      Result Value   Glucose, Bld 147 (*)    Creatinine, Ser 0.54 (*)    Total Bilirubin 1.4 (*)    All other components within normal limits  CBC - Abnormal; Notable for the following components:   WBC 12.9 (*)    Platelets 129 (*)    All other components within normal limits  URINALYSIS, COMPLETE (UACMP) WITH MICROSCOPIC - Abnormal; Notable for the following components:   Color, Urine AMBER (*)    APPearance CLEAR (*)    Specific Gravity, Urine 1.032 (*)    Hgb urine dipstick SMALL (*)    Ketones, ur 5 (*)    Protein, ur 30 (*)    Bacteria, UA RARE (*)    All other components within normal limits  GASTROINTESTINAL PANEL BY PCR, STOOL (REPLACES STOOL CULTURE)  LIPASE, BLOOD    Lab work reviewed by me with high specific gravity suggestive of dehydration.  Elevated white count is nonspecific __________________________________________  EKG   ____________________________________________  RADIOLOGY  CT abdomen pelvis reviewed by me consistent with  enterocolitis ____________________________________________   PROCEDURES  Procedure(s) performed: yes  Angiocath insertion Performed by: Merrily Brittle  Consent: Verbal consent obtained. Risks and benefits: risks, benefits and alternatives were discussed Time out: Immediately prior to procedure a "time out" was called to verify the correct patient, procedure, equipment, support staff and site/side marked as required.  Preparation: Patient was prepped and draped in the usual sterile fashion.  Vein Location: left AC  Ultrasound Guided  Gauge: 20  Normal blood return and flush without difficulty Patient tolerance: Patient tolerated the procedure well with no immediate complications.     Procedures  Critical Care performed: no  ____________________________________________   INITIAL IMPRESSION / ASSESSMENT AND PLAN / ED COURSE  Pertinent labs & imaging results that were available during my care of the patient were reviewed by me and considered in my medical decision making (see chart for details).   The patient arrives tachycardic with diarrhea and abdominal tenderness.  Differential is broad but includes infectious versus inflammatory etiology of his symptoms.  He did use amoxicillin 3 months ago although that is not typically an antibiotic associated with C. difficile order to test now.  He does require a CT scan with IV contrast along with IV fentanyl now for pain control.  After IV fentanyl the patient's pain is improved.  He has yet to be able to provide a stool sample and with no fever this makes C. difficile extremely unlikely.  CT scan shows enterocolitis which I will treat with a short course of ciprofloxacin.  He understands that if his pain does not improve he must follow-up with gastroenterology as he very well could have undiagnosed inflammatory bowel disease.  He is discharged home in improved condition verbalized understanding agree with plan.       ____________________________________________   FINAL CLINICAL IMPRESSION(S) / ED DIAGNOSES  Final diagnoses:  Enterocolitis      NEW MEDICATIONS STARTED DURING THIS VISIT:  Discharge Medication List as of 06/26/2018  2:01 PM    START taking these medications   Details  ciprofloxacin (CIPRO) 500 MG tablet Take 1 tablet (500 mg total) by mouth 2 (two) times daily for 3 days., Starting Wed 06/26/2018, Until Sat 06/29/2018, Print    HYDROcodone-acetaminophen (NORCO) 5-325 MG tablet Take 1 tablet by mouth every 6 (six) hours as needed for up to 7 doses for severe pain., Starting Wed 06/26/2018, Print    ondansetron (ZOFRAN ODT) 4 MG disintegrating tablet Take 1 tablet (4 mg total) by mouth every 8 (eight) hours as needed for nausea or vomiting., Starting Wed 06/26/2018, Print         Note:  This document was prepared using Dragon voice recognition software and may include unintentional dictation errors.     Merrily Brittle, MD 06/28/18 1304

## 2018-06-26 NOTE — ED Notes (Signed)
Patient transported to CT 

## 2018-06-26 NOTE — Discharge Instructions (Signed)
Please take all of your antibiotics as prescribed and follow-up with your primary care physician within 1 week if your symptoms do not resolve.  Return to the emergency department sooner for any concerns whatsoever.  It was a pleasure to take care of you today, and thank you for coming to our emergency department.  If you have any questions or concerns before leaving please ask the nurse to grab me and I'm more than happy to go through your aftercare instructions again.  If you were prescribed any opioid pain medication today such as Norco, Vicodin, Percocet, morphine, hydrocodone, or oxycodone please make sure you do not drive when you are taking this medication as it can alter your ability to drive safely.  If you have any concerns once you are home that you are not improving or are in fact getting worse before you can make it to your follow-up appointment, please do not hesitate to call 911 and come back for further evaluation.  Merrily Brittle, MD  Results for orders placed or performed during the hospital encounter of 06/26/18  Lipase, blood  Result Value Ref Range   Lipase 33 11 - 51 U/L  Comprehensive metabolic panel  Result Value Ref Range   Sodium 138 135 - 145 mmol/L   Potassium 3.8 3.5 - 5.1 mmol/L   Chloride 108 98 - 111 mmol/L   CO2 23 22 - 32 mmol/L   Glucose, Bld 147 (H) 70 - 99 mg/dL   BUN 12 6 - 20 mg/dL   Creatinine, Ser 6.96 (L) 0.61 - 1.24 mg/dL   Calcium 8.9 8.9 - 29.5 mg/dL   Total Protein 8.0 6.5 - 8.1 g/dL   Albumin 4.4 3.5 - 5.0 g/dL   AST 27 15 - 41 U/L   ALT 36 0 - 44 U/L   Alkaline Phosphatase 49 38 - 126 U/L   Total Bilirubin 1.4 (H) 0.3 - 1.2 mg/dL   GFR calc non Af Amer >60 >60 mL/min   GFR calc Af Amer >60 >60 mL/min   Anion gap 7 5 - 15  CBC  Result Value Ref Range   WBC 12.9 (H) 3.8 - 10.6 K/uL   RBC 5.69 4.40 - 5.90 MIL/uL   Hemoglobin 17.8 13.0 - 18.0 g/dL   HCT 28.4 13.2 - 44.0 %   MCV 90.1 80.0 - 100.0 fL   MCH 31.3 26.0 - 34.0 pg   MCHC  34.8 32.0 - 36.0 g/dL   RDW 10.2 72.5 - 36.6 %   Platelets 129 (L) 150 - 440 K/uL  Urinalysis, Complete w Microscopic  Result Value Ref Range   Color, Urine AMBER (A) YELLOW   APPearance CLEAR (A) CLEAR   Specific Gravity, Urine 1.032 (H) 1.005 - 1.030   pH 5.0 5.0 - 8.0   Glucose, UA NEGATIVE NEGATIVE mg/dL   Hgb urine dipstick SMALL (A) NEGATIVE   Bilirubin Urine NEGATIVE NEGATIVE   Ketones, ur 5 (A) NEGATIVE mg/dL   Protein, ur 30 (A) NEGATIVE mg/dL   Nitrite NEGATIVE NEGATIVE   Leukocytes, UA NEGATIVE NEGATIVE   RBC / HPF 0-5 0 - 5 RBC/hpf   WBC, UA 0-5 0 - 5 WBC/hpf   Bacteria, UA RARE (A) NONE SEEN   Squamous Epithelial / LPF 0-5 0 - 5   Mucus PRESENT    Hyaline Casts, UA PRESENT    Ct Abdomen Pelvis W Contrast  Result Date: 06/26/2018 CLINICAL DATA:  Elevated WBC.  Umbilical pain. EXAM: CT ABDOMEN AND PELVIS  WITH CONTRAST TECHNIQUE: Multidetector CT imaging of the abdomen and pelvis was performed using the standard protocol following bolus administration of intravenous contrast. CONTRAST:  OMNIPAQUE IOHEXOL 300 MG/ML  SOLN COMPARISON:  07/31/2008 FINDINGS: Lower chest: No acute abnormality. Hepatobiliary: Slightly nodular contour of the liver with small volume perihepatic ascites concerning for cirrhosis. No focal hepatic mass. No intrahepatic biliary ductal dilatation. Normal gallbladder. Pancreas: Unremarkable. No pancreatic ductal dilatation or surrounding inflammatory changes. Spleen: Normal in size without focal abnormality. Adrenals/Urinary Tract: Adrenal glands are unremarkable. Kidneys are normal, without renal calculi, focal lesion, or hydronephrosis. Bladder is unremarkable. Stomach/Bowel: Small hiatal hernia. Appendix appears normal. Bowel wall thickening of the distal ileum and terminal ileum. Fluid-filled loops of small bowel and colon. No pneumatosis, pneumoperitoneum or portal venous gas. Vascular/Lymphatic: Aortic atherosclerosis. No enlarged abdominal or pelvic  lymph nodes. Reproductive: Prostate is unremarkable. Other: No fluid collection or hematoma. Small volume abdominal ascites. Musculoskeletal: No acute osseous abnormality. No aggressive osseous lesion. Bilateral L5 pars interarticularis defects. IMPRESSION: 1. Bowel wall thickening of the distal ileum and terminal ileum and fluid-filled loops of small bowel and colon. Overall appearance is most concerning for enterocolitis secondary to an infectious or inflammatory etiology. 2. 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 Electronically Signed   By: Elige Ko   On: 06/26/2018 13:47

## 2018-07-04 ENCOUNTER — Encounter: Payer: Self-pay | Admitting: Emergency Medicine

## 2018-07-04 ENCOUNTER — Other Ambulatory Visit: Payer: Self-pay

## 2018-07-04 ENCOUNTER — Ambulatory Visit
Admission: EM | Admit: 2018-07-04 | Discharge: 2018-07-04 | Disposition: A | Discharge status: Home / Self Care | Payer: BLUE CROSS/BLUE SHIELD | Attending: Family Medicine | Admitting: Family Medicine

## 2018-07-04 DIAGNOSIS — K529 Noninfective gastroenteritis and colitis, unspecified: Secondary | ICD-10-CM | POA: Diagnosis not present

## 2018-07-04 DIAGNOSIS — K047 Periapical abscess without sinus: Secondary | ICD-10-CM

## 2018-07-04 HISTORY — DX: Male erectile dysfunction, unspecified: N52.9

## 2018-07-04 HISTORY — DX: Atherosclerotic heart disease of native coronary artery without angina pectoris: I25.10

## 2018-07-04 HISTORY — DX: Essential (primary) hypertension: I10

## 2018-07-04 MED ORDER — AMOXICILLIN-POT CLAVULANATE 875-125 MG PO TABS: 1.0000 | ORAL_TABLET | Freq: Two times a day (BID) | ORAL | 0 refills | Status: DC

## 2018-07-04 MED ORDER — TRAMADOL HCL 50 MG PO TABS: 50.0000 mg | ORAL_TABLET | Freq: Three times a day (TID) | ORAL | 0 refills | Status: DC | PRN

## 2018-07-04 NOTE — ED Triage Notes (Signed)
Patient in today stating that he is having abdominal pain, diarrhea since early hours this morning. Patient states he was seen at [facility] ED on 06/26/18 and had a CT scan and was treated with Cipro for 3 days. Patient has finished antibiotics and the abdominal pain and diarrhea has returned. Patient has an EGD scheduled 07/24/18 at [facility].  Patient also c/o dental pain on the right upper side since this morning. He spoke with dentist and he advised not to have the tooth pulled due to patient in the process of getting dental implants. Patient states that it is so painful that he can't wear his temporary partial.

## 2018-07-04 NOTE — ED Provider Notes (Addendum)
[facility]    CSN: [id number] Arrival date & time: 07/04/18  1745  History   Chief Complaint Chief Complaint  Patient presents with  . Abdominal Pain   HPI   57 year old male presents with abdominal pain and dental pain.  Patient reports that he was recently seen in the ER for abdominal pain and diarrhea.  He was seen on 7/10.  CT scan revealed enterocolitis.  He was placed on 3 days of ciprofloxacin.  He states that he improved.  However, last night he had a recurrence of symptoms.  He reports periumbilical abdominal pain which is severe at times.  Associated diarrhea.  Additionally, patient has poor dentition and is in the process of getting dental implants.  Patient states he developed severe dental pain last night and is concerned that he has an abscess.  He states that he has an area of swelling around the tooth.  Pain is severe.  No other associated symptoms.  No other complaints or concerns at this time.  Past Medical History:  Diagnosis Date  . Atrial fibrillation (HCC)   . Atrial fibrillation (HCC)   . Coronary artery disease 2016   1 stent  . Diabetes mellitus without complication (HCC)   . ED (erectile dysfunction)   . Hepatitis C   . Hypertension   . Obesity     Patient Active Problem List   Diagnosis Date Noted  . DYSLIPIDEMIA 05/12/2010  . HYPERTENSION, HEART CONTROLLED W/O ASSOC CHF 05/12/2010  . OVERWEIGHT/OBESITY 05/25/2009  . ATRIAL FIBRILLATION 05/25/2009    Past Surgical History:  Procedure Laterality Date  . CORONARY STENT PLACEMENT  2016       Home Medications    Prior to Admission medications   Medication Sig Start Date End Date Taking? Authorizing Provider  atorvastatin (LIPITOR) 80 MG tablet Take 1 tablet by mouth daily. 06/16/18  Yes [provider]  HYDROcodone-acetaminophen (NORCO) 5-325 MG tablet Take 1 tablet by mouth every 6 (six) hours as needed for up to 7 doses for severe pain. 06/26/18  Yes Merrily Brittle, MD    lisinopril (PRINIVIL,ZESTRIL) 20 MG tablet Take 1 tablet by mouth daily. 03/21/18 03/21/19 Yes [provider]  metFORMIN (GLUCOPHAGE-XR) 500 MG 24 hr tablet Take 2 tablets by mouth daily after supper. 01/30/18 01/30/19 Yes [provider]  metoprolol tartrate (LOPRESSOR) 25 MG tablet Take 0.5 tablets by mouth 2 (two) times daily. 03/21/18 03/21/19 Yes [provider]  ondansetron (ZOFRAN ODT) 4 MG disintegrating tablet Take 1 tablet (4 mg total) by mouth every 8 (eight) hours as needed for nausea or vomiting. 06/26/18  Yes Merrily Brittle, MD  sildenafil (VIAGRA) 25 MG tablet Take 1 tablet by mouth daily as needed. 05/29/18  Yes [provider]  sitaGLIPtin (JANUVIA) 100 MG tablet Take 1 tablet by mouth daily. 01/30/18 01/30/19 Yes [provider]  ticagrelor (BRILINTA) 90 MG TABS tablet Take 1 tablet by mouth every 12 (twelve) hours. 03/21/18  Yes [provider]  amoxicillin-clavulanate (AUGMENTIN) 875-125 MG tablet Take 1 tablet by mouth every 12 (twelve) hours. 07/04/18   Tommie Sams, DO  traMADol (ULTRAM) 50 MG tablet Take 1 tablet (50 mg total) by mouth every 8 (eight) hours as needed. 07/04/18   Tommie Sams, DO    Family History Family History  Problem Relation Age of Onset  . Other Mother 37       died from complications of gastric bypass surgery  . Thyroid disease Mother   .  Alzheimer's disease Father   . COPD Father   . Hypertension Father   . Hyperlipidemia Father   . Heart disease Father     Social History Social History   Tobacco Use  . Smoking status: Current Every Day Smoker    Packs/day: 0.50    Types: Cigarettes  . Smokeless tobacco: Never Used  Substance Use Topics  . Alcohol use: No  . Drug use: Never     Allergies   Pseudoephedrine   Review of Systems Review of Systems  HENT: Positive for dental problem.   Gastrointestinal: Positive for abdominal pain and diarrhea.   Physical Exam Triage Vital Signs ED  Triage Vitals  Enc Vitals Group     BP 07/04/18 1803 (!) 155/90     Pulse Rate 07/04/18 1803 85     Resp 07/04/18 1803 16     Temp 07/04/18 1803 98.6 F (37 C)     Temp Source 07/04/18 1803 Oral     SpO2 07/04/18 1803 98 %     Weight 07/04/18 1803 249 lb (112.9 kg)     Height 07/04/18 1803 6\' 2"  (1.88 m)     Head Circumference --      Peak Flow --      Pain Score 07/04/18 1802 9     Pain Loc --      Pain Edu? --      Excl. in GC? --    Updated Vital Signs BP (!) 155/90 (BP Location: Left Arm)   Pulse 85   Temp 98.6 F (37 C) (Oral)   Resp 16   Ht 6\' 2"  (1.88 m)   Wt 249 lb (112.9 kg)   SpO2 98%   BMI 31.97 kg/m   Visual Acuity Right Eye Distance:   Left Eye Distance:   Bilateral Distance:    Right Eye Near:   Left Eye Near:    Bilateral Near:     Physical Exam  Constitutional: He is oriented to person, place, and time. He appears well-developed. No distress.  HENT:  Head: Normocephalic and atraumatic.  Very poor dentition.  Numerous eroding teeth.  Patient has an area of swelling of the upper palate around the area of concern.  Cardiovascular: Normal rate and regular rhythm.  Pulmonary/Chest: Effort normal and breath sounds normal. He has no wheezes. He has no rales.  Abdominal: Soft.  Tender to palpation at the umbilicus.  Neurological: He is alert and oriented to person, place, and time.  Psychiatric: His behavior is normal.  Flat affect.  Nursing note and vitals reviewed.  UC Treatments / Results  Labs (all labs ordered are listed, but only abnormal results are displayed) Labs Reviewed - No data to display  EKG None  Radiology No results found.  Procedures Procedures (including critical care time)  Medications Ordered in UC Medications - No data to display  Initial Impression / Assessment and Plan / UC Course  I have reviewed the triage vital signs and the nursing notes.  Pertinent labs & imaging results that were available during my care of  the patient were reviewed by me and considered in my medical decision making (see chart for details).    57 year old male presents with symptoms of enterocolitis.  Patient only had a brief course of Cipro.  He had no anaerobic coverage.  Placing on Augmentin which will cover for intra-abdominal infection as well as dental infection.  Final Clinical Impressions(s) / UC Diagnoses   Final diagnoses:  Enterocolitis  Dental infection     Discharge Instructions     Antibiotic as prescribed.  Take care  Dr. Adriana Simas    ED Prescriptions    Medication Sig Dispense Auth. Provider   amoxicillin-clavulanate (AUGMENTIN) 875-125 MG tablet Take 1 tablet by mouth every 12 (twelve) hours. 20 tablet Kristelle Cavallaro G, DO   traMADol (ULTRAM) 50 MG tablet Take 1 tablet (50 mg total) by mouth every 8 (eight) hours as needed. 15 tablet Tommie Sams, DO     Controlled Substance Prescriptions [place] Controlled Substance Registry consulted? Yes, I have consulted the  Controlled Substances Registry for this patient.  Patient requesting pain medication due to dental pain.  I am proceeding with tramadol as I feel that this is safe.  He was recently given Hydrocodone.      Tommie Sams, [place] 07/04/18 [phone]

## 2018-07-04 NOTE — Discharge Instructions (Signed)
Antibiotic as prescribed.  Take care  Dr. Havanah Nelms  

## 2018-12-08 ENCOUNTER — Other Ambulatory Visit: Payer: Self-pay

## 2018-12-08 ENCOUNTER — Emergency Department: Payer: BLUE CROSS/BLUE SHIELD

## 2018-12-08 ENCOUNTER — Inpatient Hospital Stay
Admission: EM | Admit: 2018-12-08 | Discharge: 2018-12-10 | DRG: 308 | Disposition: A | Discharge status: Home / Self Care | Payer: BLUE CROSS/BLUE SHIELD | Attending: Family Medicine | Admitting: Family Medicine

## 2018-12-08 DIAGNOSIS — Z6831 Body mass index (BMI) 31.0-31.9, adult: Secondary | ICD-10-CM | POA: Diagnosis not present

## 2018-12-08 DIAGNOSIS — T44995A Adverse effect of other drug primarily affecting the autonomic nervous system, initial encounter: Secondary | ICD-10-CM | POA: Diagnosis present

## 2018-12-08 DIAGNOSIS — E669 Obesity, unspecified: Secondary | ICD-10-CM | POA: Diagnosis present

## 2018-12-08 DIAGNOSIS — J189 Pneumonia, unspecified organism: Secondary | ICD-10-CM | POA: Diagnosis present

## 2018-12-08 DIAGNOSIS — E119 Type 2 diabetes mellitus without complications: Secondary | ICD-10-CM | POA: Diagnosis present

## 2018-12-08 DIAGNOSIS — N529 Male erectile dysfunction, unspecified: Secondary | ICD-10-CM | POA: Diagnosis present

## 2018-12-08 DIAGNOSIS — I5021 Acute systolic (congestive) heart failure: Secondary | ICD-10-CM | POA: Diagnosis present

## 2018-12-08 DIAGNOSIS — I429 Cardiomyopathy, unspecified: Secondary | ICD-10-CM | POA: Diagnosis present

## 2018-12-08 DIAGNOSIS — Z888 Allergy status to other drugs, medicaments and biological substances status: Secondary | ICD-10-CM

## 2018-12-08 DIAGNOSIS — I252 Old myocardial infarction: Secondary | ICD-10-CM

## 2018-12-08 DIAGNOSIS — Z8619 Personal history of other infectious and parasitic diseases: Secondary | ICD-10-CM

## 2018-12-08 DIAGNOSIS — Z7984 Long term (current) use of oral hypoglycemic drugs: Secondary | ICD-10-CM

## 2018-12-08 DIAGNOSIS — Z955 Presence of coronary angioplasty implant and graft: Secondary | ICD-10-CM | POA: Diagnosis not present

## 2018-12-08 DIAGNOSIS — I2510 Atherosclerotic heart disease of native coronary artery without angina pectoris: Secondary | ICD-10-CM | POA: Diagnosis present

## 2018-12-08 DIAGNOSIS — I4891 Unspecified atrial fibrillation: Secondary | ICD-10-CM | POA: Diagnosis present

## 2018-12-08 DIAGNOSIS — E785 Hyperlipidemia, unspecified: Secondary | ICD-10-CM | POA: Diagnosis present

## 2018-12-08 DIAGNOSIS — Z79899 Other long term (current) drug therapy: Secondary | ICD-10-CM

## 2018-12-08 DIAGNOSIS — I480 Paroxysmal atrial fibrillation: Secondary | ICD-10-CM | POA: Diagnosis present

## 2018-12-08 DIAGNOSIS — T464X5A Adverse effect of angiotensin-converting-enzyme inhibitors, initial encounter: Secondary | ICD-10-CM | POA: Diagnosis present

## 2018-12-08 DIAGNOSIS — I4811 Longstanding persistent atrial fibrillation: Secondary | ICD-10-CM

## 2018-12-08 DIAGNOSIS — I110 Hypertensive heart disease with heart failure: Secondary | ICD-10-CM | POA: Diagnosis present

## 2018-12-08 DIAGNOSIS — F17210 Nicotine dependence, cigarettes, uncomplicated: Secondary | ICD-10-CM | POA: Diagnosis present

## 2018-12-08 DIAGNOSIS — J90 Pleural effusion, not elsewhere classified: Secondary | ICD-10-CM

## 2018-12-08 LAB — CBC
HCT: 45 % (ref 39.0–52.0)
Hemoglobin: 14.5 g/dL (ref 13.0–17.0)
MCH: 29.4 pg (ref 26.0–34.0)
MCHC: 32.2 g/dL (ref 30.0–36.0)
MCV: 91.3 fL (ref 80.0–100.0)
Platelets: 114 10*3/uL — ABNORMAL LOW (ref 150–400)
RBC: 4.93 MIL/uL (ref 4.22–5.81)
RDW: 13.1 % (ref 11.5–15.5)
WBC: 8.5 10*3/uL (ref 4.0–10.5)
nRBC: 0 % (ref 0.0–0.2)

## 2018-12-08 LAB — BASIC METABOLIC PANEL
Anion gap: 10 (ref 5–15)
BUN: 18 mg/dL (ref 6–20)
CHLORIDE: 100 mmol/L (ref 98–111)
CO2: 25 mmol/L (ref 22–32)
Calcium: 9.1 mg/dL (ref 8.9–10.3)
Creatinine, Ser: 0.92 mg/dL (ref 0.61–1.24)
GFR calc Af Amer: >60 mL/min (ref >60)
GFR calc non Af Amer: >60 mL/min (ref >60)
Glucose, Bld: 151 mg/dL — ABNORMAL HIGH (ref 70–99)
POTASSIUM: 4.2 mmol/L (ref 3.5–5.1)
Sodium: 135 mmol/L (ref 135–145)

## 2018-12-08 LAB — PROCALCITONIN: Procalcitonin: <0.1 ng/mL

## 2018-12-08 LAB — TSH: TSH: 1.5 u[IU]/mL (ref 0.350–4.500)

## 2018-12-08 LAB — BRAIN NATRIURETIC PEPTIDE: B NATRIURETIC PEPTIDE 5: 568 pg/mL — AB (ref 0.0–100.0)

## 2018-12-08 LAB — TROPONIN I: Troponin I: 0.03 ng/mL (ref <0.03)

## 2018-12-08 MED ORDER — HYDRALAZINE HCL 20 MG/ML IJ SOLN: 10.0000 mg | INTRAMUSCULAR | Status: DC | PRN

## 2018-12-08 MED ORDER — NITROGLYCERIN 0.4 MG SL SUBL
0.4000 mg | SUBLINGUAL_TABLET | SUBLINGUAL | Status: DC | PRN
  Administered 2018-12-09 (×2): 0.4 mg via SUBLINGUAL
  Filled 2018-12-08 (×2): qty 1

## 2018-12-08 MED ORDER — DILTIAZEM HCL-DEXTROSE 100-5 MG/100ML-% IV SOLN (PREMIX)
5.0000 mg/h | INTRAVENOUS | Status: DC
  Administered 2018-12-08: 5 mg/h via INTRAVENOUS
  Administered 2018-12-09: 7.5 mg/h via INTRAVENOUS
  Filled 2018-12-08 (×2): qty 100

## 2018-12-08 MED ORDER — INSULIN ASPART 100 UNIT/ML ~~LOC~~ SOLN: 0.0000 [IU] | Freq: Every day | SUBCUTANEOUS | Status: DC

## 2018-12-08 MED ORDER — MORPHINE SULFATE (PF) 2 MG/ML IV SOLN: 2.0000 mg | INTRAVENOUS | Status: DC | PRN

## 2018-12-08 MED ORDER — DILTIAZEM HCL 60 MG PO TABS
30.0000 mg | ORAL_TABLET | Freq: Once | ORAL | Status: AC
  Administered 2018-12-08: 30 mg via ORAL
  Filled 2018-12-08: qty 1

## 2018-12-08 MED ORDER — ONDANSETRON 4 MG PO TBDP
4.0000 mg | ORAL_TABLET | Freq: Three times a day (TID) | ORAL | Status: DC | PRN
  Filled 2018-12-08: qty 1

## 2018-12-08 MED ORDER — ACETAMINOPHEN 325 MG PO TABS: 650.0000 mg | ORAL_TABLET | ORAL | Status: DC | PRN

## 2018-12-08 MED ORDER — FUROSEMIDE 40 MG PO TABS
20.0000 mg | ORAL_TABLET | Freq: Once | ORAL | Status: AC
  Administered 2018-12-08: 20 mg via ORAL
  Filled 2018-12-08: qty 1

## 2018-12-08 MED ORDER — DILTIAZEM HCL 25 MG/5ML IV SOLN
20.0000 mg | Freq: Once | INTRAVENOUS | Status: AC
  Administered 2018-12-08: 20 mg via INTRAVENOUS
  Filled 2018-12-08: qty 5

## 2018-12-08 MED ORDER — FUROSEMIDE 10 MG/ML IJ SOLN
20.0000 mg | Freq: Two times a day (BID) | INTRAMUSCULAR | Status: DC
  Administered 2018-12-09 – 2018-12-10 (×3): 20 mg via INTRAVENOUS
  Filled 2018-12-08 (×3): qty 2

## 2018-12-08 MED ORDER — METOPROLOL TARTRATE 25 MG PO TABS
12.5000 mg | ORAL_TABLET | Freq: Two times a day (BID) | ORAL | Status: DC
  Administered 2018-12-09 (×2): 12.5 mg via ORAL
  Filled 2018-12-08 (×2): qty 1

## 2018-12-08 MED ORDER — LINAGLIPTIN 5 MG PO TABS
5.0000 mg | ORAL_TABLET | Freq: Every day | ORAL | Status: DC
  Administered 2018-12-09 – 2018-12-10 (×2): 5 mg via ORAL
  Filled 2018-12-08 (×2): qty 1

## 2018-12-08 MED ORDER — ATORVASTATIN CALCIUM 20 MG PO TABS
80.0000 mg | ORAL_TABLET | Freq: Every day | ORAL | Status: DC
  Administered 2018-12-09: 80 mg via ORAL
  Filled 2018-12-08: qty 4

## 2018-12-08 MED ORDER — SODIUM CHLORIDE 0.9 % IV BOLUS
500.0000 mL | Freq: Once | INTRAVENOUS | Status: AC
  Administered 2018-12-08: 500 mL via INTRAVENOUS

## 2018-12-08 MED ORDER — SODIUM CHLORIDE 0.9% FLUSH
3.0000 mL | Freq: Two times a day (BID) | INTRAVENOUS | Status: DC
  Administered 2018-12-09 – 2018-12-10 (×4): 3 mL via INTRAVENOUS

## 2018-12-08 MED ORDER — SODIUM CHLORIDE 0.9 % IV SOLN
500.0000 mg | INTRAVENOUS | Status: DC
  Administered 2018-12-09 (×2): 500 mg via INTRAVENOUS
  Filled 2018-12-08 (×3): qty 500

## 2018-12-08 MED ORDER — ENOXAPARIN SODIUM 40 MG/0.4ML ~~LOC~~ SOLN: 40.0000 mg | SUBCUTANEOUS | Status: DC

## 2018-12-08 MED ORDER — LISINOPRIL 10 MG PO TABS
20.0000 mg | ORAL_TABLET | Freq: Every day | ORAL | Status: DC
  Administered 2018-12-09 – 2018-12-10 (×2): 20 mg via ORAL
  Filled 2018-12-08 (×2): qty 2

## 2018-12-08 MED ORDER — SODIUM CHLORIDE 0.9 % IV SOLN
250.0000 mL | INTRAVENOUS | Status: DC | PRN
  Administered 2018-12-09: 250 mL via INTRAVENOUS

## 2018-12-08 MED ORDER — ONDANSETRON HCL 4 MG/2ML IJ SOLN: 4.0000 mg | Freq: Four times a day (QID) | INTRAMUSCULAR | Status: DC | PRN

## 2018-12-08 MED ORDER — HYDROCODONE-ACETAMINOPHEN 5-325 MG PO TABS
1.0000 | ORAL_TABLET | Freq: Four times a day (QID) | ORAL | Status: DC | PRN
  Administered 2018-12-09: 1 via ORAL
  Filled 2018-12-08: qty 1

## 2018-12-08 MED ORDER — SODIUM CHLORIDE 0.9% FLUSH: 3.0000 mL | INTRAVENOUS | Status: DC | PRN

## 2018-12-08 MED ORDER — SODIUM CHLORIDE 0.9 % IV SOLN
1.0000 g | INTRAVENOUS | Status: DC
  Administered 2018-12-09 (×2): 1 g via INTRAVENOUS
  Filled 2018-12-08: qty 10
  Filled 2018-12-08: qty 1
  Filled 2018-12-08: qty 10

## 2018-12-08 MED ORDER — ALPRAZOLAM 0.5 MG PO TABS
0.2500 mg | ORAL_TABLET | Freq: Two times a day (BID) | ORAL | Status: DC | PRN
  Administered 2018-12-09: 0.25 mg via ORAL
  Filled 2018-12-08: qty 1

## 2018-12-08 MED ORDER — TICAGRELOR 90 MG PO TABS
90.0000 mg | ORAL_TABLET | Freq: Two times a day (BID) | ORAL | Status: DC
  Administered 2018-12-09 – 2018-12-10 (×4): 90 mg via ORAL
  Filled 2018-12-08 (×4): qty 1

## 2018-12-08 MED ORDER — INSULIN ASPART 100 UNIT/ML ~~LOC~~ SOLN
0.0000 [IU] | Freq: Three times a day (TID) | SUBCUTANEOUS | Status: DC
  Administered 2018-12-09 – 2018-12-10 (×4): 3 [IU] via SUBCUTANEOUS
  Filled 2018-12-08 (×5): qty 1

## 2018-12-08 NOTE — ED Notes (Signed)
Attempted IV x 1. Pt states normally has to have [place] IV placed.

## 2018-12-08 NOTE — ED Notes (Signed)
EDP notified of critical lab value. No new orders at this time.

## 2018-12-08 NOTE — H&P (Addendum)
[facility] [place] [facility]   PATIENT NAME: [name]    MR#:  [id number]  DATE OF BIRTH:  January 15, 1961  DATE OF ADMISSION:  12/08/2018  PRIMARY CARE PHYSICIAN: Arne [place], MD   REQUESTING/REFERRING PHYSICIAN:   CHIEF COMPLAINT:   Chief Complaint  Patient presents with  . Chest Pain  . Atrial Fibrillation    HISTORY OF PRESENT ILLNESS: Ganesh Deeg  is a 57 y.o. male with a known history A. fib-exacerbated by Sudafed, took Sudafed yesterday and the day before, complaining of palpitations, shortness of breath chest discomfort, lower extremity edema, in the emergency room patient was found to be in A. fib with RVR with heart rate in the 140s, chest x-ray noted for right-sided pneumonia, BNP greater than 500, patient evaluated in the emergency room, no apparent distress, resting comfortably in bed, patient now being admitted for acute paroxysmal A. fib most likely secondary to Sudafed usage, and mild congestive heart failure exacerbation.  PAST MEDICAL HISTORY:   Past Medical History:  Diagnosis Date  . Atrial fibrillation (HCC)   . Atrial fibrillation (HCC)   . Coronary artery disease 2016   1 stent  . Diabetes mellitus without complication (HCC)   . ED (erectile dysfunction)   . Hepatitis C   . Hypertension   . Obesity     PAST SURGICAL HISTORY:  Past Surgical History:  Procedure Laterality Date  . CORONARY STENT PLACEMENT  2016    SOCIAL HISTORY:  Social History   Tobacco Use  . Smoking status: Current Every Day Smoker    Packs/day: 0.50    Types: Cigarettes  . Smokeless tobacco: Never Used  Substance Use Topics  . Alcohol use: No    FAMILY HISTORY:  Family History  Problem Relation Age of Onset  . Other Mother 37       died from complications of gastric bypass surgery  . Thyroid disease Mother   . Alzheimer's disease Father   . COPD Father   . Hypertension Father   . Hyperlipidemia Father   . Heart disease Father      DRUG ALLERGIES:  Allergies  Allergen Reactions  . Pseudoephedrine     REACTION: ? starts him in A fib    REVIEW OF SYSTEMS:   CONSTITUTIONAL: No fever, fatigue or weakness.  EYES: No blurred or double vision.  EARS, NOSE, AND THROAT: No tinnitus or ear pain.  RESPIRATORY: + cough, shortness of breath, no wheezing or hemoptysis.  CARDIOVASCULAR: + chest pain, orthopnea, edema.  GASTROINTESTINAL: No nausea, vomiting, diarrhea or abdominal pain.  GENITOURINARY: No dysuria, hematuria.  ENDOCRINE: No polyuria, nocturia,  HEMATOLOGY: No anemia, easy bruising or bleeding SKIN: No rash or lesion. MUSCULOSKELETAL: No joint pain or arthritis.   NEUROLOGIC: No tingling, numbness, weakness.  PSYCHIATRY: No anxiety or depression.   MEDICATIONS AT HOME:  Prior to Admission medications   Medication Sig Start Date End Date Taking? Authorizing Provider  HYDROcodone-acetaminophen (NORCO) 5-325 MG tablet Take 1 tablet by mouth every 6 (six) hours as needed for up to 7 doses for severe pain. 06/26/18  Yes Merrily Brittle, MD  lisinopril (PRINIVIL,ZESTRIL) 20 MG tablet Take 1 tablet by mouth daily. 03/21/18 03/21/19 Yes [provider]  metFORMIN (GLUCOPHAGE-XR) 500 MG 24 hr tablet Take 2 tablets by mouth daily after supper. 01/30/18 01/30/19 Yes [provider]  metoprolol tartrate (LOPRESSOR) 25 MG tablet Take 0.5 tablets by mouth 2 (two) times daily. 03/21/18 03/21/19 Yes [provider]  sitaGLIPtin (JANUVIA) 100 MG tablet Take 1 tablet by mouth daily. 01/30/18 01/30/19 Yes [provider]  ticagrelor (BRILINTA) 90 MG TABS tablet Take 1 tablet by mouth every 12 (twelve) hours. 03/21/18  Yes [provider]  atorvastatin (LIPITOR) 80 MG tablet Take 1 tablet by mouth daily. 06/16/18   [provider]  ondansetron (ZOFRAN ODT) 4 MG disintegrating tablet Take 1 tablet (4 mg total) by mouth every 8 (eight) hours as needed for nausea or vomiting. 06/26/18    Merrily Brittleifenbark, Neil, MD  sildenafil (VIAGRA) 25 MG tablet Take 1 tablet by mouth daily as needed. 05/29/18   [provider]  traMADol (ULTRAM) 50 MG tablet Take 1 tablet (50 mg total) by mouth every 8 (eight) hours as needed. 07/04/18   Tommie Samsook, Jayce G, DO      PHYSICAL EXAMINATION:   VITAL SIGNS: Blood pressure 115/70, pulse (!) 102, temperature 98.1 F (36.7 C), temperature source Oral, resp. rate 17, height 6\' 3"  (1.905 m), weight 97.1 kg, SpO2 97 %.  GENERAL:  57 y.o.-year-old patient lying in the bed with no acute distress.  EYES: Pupils equal, round, reactive to light and accommodation. No scleral icterus. Extraocular muscles intact.  HEENT: Head atraumatic, normocephalic. Oropharynx and nasopharynx clear.  NECK:  Supple, no jugular venous distention. No thyroid enlargement, no tenderness.  LUNGS: Normal breath sounds bilaterally, no wheezing, rales,rhonchi or crepitation. No use of accessory muscles of respiration.  CARDIOVASCULAR: Regular rate and rhythm, S1, S2 normal. No murmurs, rubs, or gallops.  ABDOMEN: Soft, nontender, nondistended. Bowel sounds present. No organomegaly or mass.  EXTREMITIES: Bilateral lower extremity edema, no cyanosis, or clubbing.  NEUROLOGIC: Cranial nerves II through XII are intact. MAES. Gait not checked.  PSYCHIATRIC: The patient is alert and oriented x 3.  SKIN: No obvious rash, lesion, or ulcer.   LABORATORY PANEL:   CBC Recent Labs  Lab 12/08/18 1910  WBC 8.5  HGB 14.5  HCT 45.0  PLT 114*  MCV 91.3  MCH 29.4  MCHC 32.2  RDW 13.1   ------------------------------------------------------------------------------------------------------------------  Chemistries  Recent Labs  Lab 12/08/18 1910  NA 135  K 4.2  CL 100  CO2 25  GLUCOSE 151*  BUN 18  CREATININE 0.92  CALCIUM 9.1   ------------------------------------------------------------------------------------------------------------------ estimated creatinine clearance is  104.6 mL/min (by C-G formula based on SCr of 0.92 mg/dL). ------------------------------------------------------------------------------------------------------------------ No results for input(s): TSH, T4TOTAL, T3FREE, THYROIDAB in the last 72 hours.  Invalid input(s): FREET3   Coagulation profile No results for input(s): INR, PROTIME in the last 168 hours. ------------------------------------------------------------------------------------------------------------------- No results for input(s): DDIMER in the last 72 hours. -------------------------------------------------------------------------------------------------------------------  Cardiac Enzymes Recent Labs  Lab 12/08/18 1910  TROPONINI 0.03*   ------------------------------------------------------------------------------------------------------------------ Invalid input(s): POCBNP  ---------------------------------------------------------------------------------------------------------------  Urinalysis    Component Value Date/Time   COLORURINE AMBER (A) 06/26/2018 1027   APPEARANCEUR CLEAR (A) 06/26/2018 1027   LABSPEC 1.032 (H) 06/26/2018 1027   PHURINE 5.0 06/26/2018 1027   GLUCOSEU NEGATIVE 06/26/2018 1027   HGBUR SMALL (A) 06/26/2018 1027   BILIRUBINUR NEGATIVE 06/26/2018 1027   KETONESUR 5 (A) 06/26/2018 1027   PROTEINUR 30 (A) 06/26/2018 1027   NITRITE NEGATIVE 06/26/2018 1027   LEUKOCYTESUR NEGATIVE 06/26/2018 1027     RADIOLOGY: Dg Chest 2 View  Result Date: 12/08/2018 CLINICAL DATA:  Chest tightness and shortness of breath for 2 days EXAM: CHEST - 2 VIEW COMPARISON:  None. FINDINGS: Cardiac shadow is enlarged. Mild aortic calcifications are noted. The lungs are well aerated bilaterally. Small  effusions are noted posteriorly as well as some mild right lower lobe infiltrate. No acute bony abnormality is seen. IMPRESSION: Mild right lower lobe infiltrate as well as small pleural effusions bilaterally.  Electronically Signed   By: Alcide Clever M.D.   On: 12/08/2018 20:08    EKG: Orders placed or performed during the hospital encounter of 12/08/18  . ED EKG within 10 minutes  . ED EKG within 10 minutes  . EKG 12-Lead  . EKG 12-Lead    IMPRESSION AND PLAN: *Acute paroxysmal A. fib with RVR With associated mild congestive heart failure exacerbation, status post cardioversion in the past Secondary to medication side effect/Sudafed usage Admit to telemetry bed, rule out acute coronary syndrome with cardiac enzymes x3 sets, check echocardiogram, cardiology to see-we will defer anticoagulation to cardiology, check TSH, on Brilinta, continue lisinopril, Lopressor, statin therapy, n.p.o. after midnight in case cardioversion done on tomorrow, and continue close medical monitoring   *Acute mild diastolic congestive heart failure  Secondary to above  Congestive heart failure protocol, lisinopril, Lopressor, statin therapy, strict I&O monitoring, daily weights, IV Lasix for short course for now, check echocardiogram, cardiology to see, rule out acute coronary syndrome as stated above  *Acute right-sided pneumonia Pneumonia protocol, empiric Rocephin/azithromycin, follow-up on cultures, check procalcitonin as this may be overcall  *Chronic diabetes mellitus type 2 Hold metformin, continue Januvia, slight scale insulin Accu-Cheks per routine  *Chronic benign essential hypertension Stable on home regiment which will be continued  *History of coronary artery disease status post stenting in 2016 Stable Continue lisinopril, beta-blocker therapy, Brilinta, statin therapy, nitrates as needed   All the records are reviewed and case discussed with ED provider. Management plans discussed with the patient, family and they are in agreement.  CODE STATUS:full    Code Status Orders  (From admission, onward)         Start     Ordered   12/08/18 2204  Full code  Continuous     12/08/18 2203         Code Status History    This patient has a current code status but no historical code status.       TOTAL TIME TAKING CARE OF THIS PATIENT: 40 minutes.    Evelena Asa Charlene Cowdrey M.D on 12/08/2018   Between 7am to 6pm - Pager - [phone]  After 6pm go to [email or web address] - password [email or web address] [facility]  [facility]  Office  [phone]  CC: Primary care physician; Arne [place], MD   Note: This dictation was prepared with Dragon dictation along with smaller phrase technology. Any transcriptional errors that result from this process are unintentional.

## 2018-12-08 NOTE — ED Triage Notes (Signed)
A&O, ambulatory. No distress noted.  States chest tightness and SOB that began last night. Points to L sided chest. States it went away and came back. Pt states "I think my rhythm is irregular or something." also states leg swelling. Takes brilenta. MI with stents in 2016.   States he thinks he took sudafed yesterday or 2 days ago. States that sudafed puts him into a fib.

## 2018-12-08 NOTE — ED Notes (Signed)
Pt to xray at this time.

## 2018-12-08 NOTE — ED Notes (Signed)
EKG obtained

## 2018-12-08 NOTE — ED Notes (Signed)
Patient placed on monitor

## 2018-12-08 NOTE — ED Notes (Signed)
EDP in with patient 

## 2018-12-08 NOTE — ED Notes (Addendum)
Nurse unable to receive report at this time. 

## 2018-12-08 NOTE — ED Notes (Signed)
Patient transferred to Xray

## 2018-12-08 NOTE — ED Notes (Signed)
Pads placed on pt 

## 2018-12-08 NOTE — ED Notes (Signed)
PO meds given

## 2018-12-08 NOTE — ED Provider Notes (Addendum)
[facility] Emergency Department Provider Note       Time seen: ----------------------------------------- 7:06 PM on 12/08/2018 -----------------------------------------   I have reviewed the triage vital signs and the nursing notes.  HISTORY   Chief Complaint Chest Pain and Atrial Fibrillation    HPI [name] is a 57 y.o. male with a history of paroxysmal atrial fibrillation, coronary artery disease, diabetes, hepatitis C, hypertension who presents to the ED for chest tightness and shortness of breath that began last night.  Patient reports left-sided chest, states it went away and came back.  Currently he does not have any.  He has had 2 episodes in the past of atrial fibrillation, one that improved with chemical cardioversion and one that required electrical cardioversion.  Patient reports an MI with stents in 2016.  He took Sudafed yesterday which triggered his 2 prior bouts of atrial fibrillation  Past Medical History:  Diagnosis Date  . Atrial fibrillation (HCC)   . Atrial fibrillation (HCC)   . Coronary artery disease 2016   1 stent  . Diabetes mellitus without complication (HCC)   . ED (erectile dysfunction)   . Hepatitis C   . Hypertension   . Obesity     Patient Active Problem List   Diagnosis Date Noted  . DYSLIPIDEMIA 05/12/2010  . HYPERTENSION, HEART CONTROLLED W/O ASSOC CHF 05/12/2010  . OVERWEIGHT/OBESITY 05/25/2009  . ATRIAL FIBRILLATION 05/25/2009    Past Surgical History:  Procedure Laterality Date  . CORONARY STENT PLACEMENT  2016    Allergies Pseudoephedrine  Social History Social History   Tobacco Use  . Smoking status: Current Every Day Smoker    Packs/day: 0.50    Types: Cigarettes  . Smokeless tobacco: Never Used  Substance Use Topics  . Alcohol use: No  . Drug use: Never   Review of Systems Constitutional: Negative for fever. Cardiovascular: Positive for chest pain, rapid heartbeat Respiratory:  Positive for shortness of breath Gastrointestinal: Negative for abdominal pain, vomiting and diarrhea. Musculoskeletal: Negative for back pain. Skin: Negative for rash. Neurological: Negative for headaches, focal weakness or numbness.  All systems negative/normal/unremarkable except as stated in the HPI  ____________________________________________   PHYSICAL EXAM:  VITAL SIGNS: ED Triage Vitals  Enc Vitals Group     BP 12/08/18 1852 (!) 131/91     Pulse Rate 12/08/18 1852 (!) 110     Resp 12/08/18 1852 16     Temp 12/08/18 1852 98.1 F (36.7 C)     Temp Source 12/08/18 1852 Oral     SpO2 12/08/18 1852 100 %     Weight 12/08/18 1847 214 lb (97.1 kg)     Height 12/08/18 1847 6\' 3"  (1.905 m)     Head Circumference --      Peak Flow --      Pain Score 12/08/18 1847 5     Pain Loc --      Pain Edu? --      Excl. in GC? --    Constitutional: Alert and oriented. Well appearing and in no distress. Eyes: Conjunctivae are normal. Normal extraocular movements. ENT   Head: Normocephalic and atraumatic.   Nose: No congestion/rhinnorhea.   Mouth/Throat: Mucous membranes are moist.   Neck: No stridor. Cardiovascular: Good rate, irregular rhythm. No murmurs, rubs, or gallops. Respiratory: Normal respiratory effort without tachypnea nor retractions. Breath sounds are clear and equal bilaterally. No wheezes/rales/rhonchi. Gastrointestinal: Soft and nontender. Normal bowel sounds Musculoskeletal: Nontender with normal range of motion in  extremities. No lower extremity tenderness nor edema. Neurologic:  Normal speech and language. No gross focal neurologic deficits are appreciated.  Skin:  Skin is warm, dry and intact. No rash noted. Psychiatric: Mood and affect are normal. Speech and behavior are normal.  ____________________________________________  EKG: Interpreted by me.  Atrial fibrillation with a rapid ventricular response, rate is 149 bpm, borderline long QT, normal  axis. Repeat EKG interpreted by me, atrial fibrillation with a rate of 99 bpm, normal QRS width, normal axis, long QT ____________________________________________  ED COURSE:  As part of my medical decision making, I reviewed the following data within the electronic MEDICAL RECORD NUMBER History obtained from family if available, nursing notes, old chart and ekg, as well as notes from prior ED visits. Patient presented for rapid atrial fibrillation, we will assess with labs and imaging as indicated at this time.   Procedures ____________________________________________   LABS (pertinent positives/negatives)  Labs Reviewed  BASIC METABOLIC PANEL - Abnormal; Notable for the following components:      Result Value   Glucose, Bld 151 (*)    All other components within normal limits  CBC - Abnormal; Notable for the following components:   Platelets 114 (*)    All other components within normal limits  TROPONIN I - Abnormal; Notable for the following components:   Troponin I 0.03 (*)    All other components within normal limits  BRAIN NATRIURETIC PEPTIDE - Abnormal; Notable for the following components:   B Natriuretic Peptide 568.0 (*)    All other components within normal limits  URINE DRUG SCREEN, QUALITATIVE ([facility] ONLY)   CRITICAL CARE Performed by: Ulice Dash   Total critical care time: 30 minutes  Critical care time was exclusive of separately billable procedures and treating other patients.  Critical care was necessary to treat or prevent imminent or life-threatening deterioration.  Critical care was time spent personally by me on the following activities: development of treatment plan with patient and/or surrogate as well as nursing, discussions with consultants, evaluation of patient's response to treatment, examination of patient, obtaining history from patient or surrogate, ordering and performing treatments and interventions, ordering and review of laboratory  studies, ordering and review of radiographic studies, pulse oximetry and re-evaluation of patient's condition.  RADIOLOGY Images were viewed by me  Chest x-ray IMPRESSION: Mild right lower lobe infiltrate as well as small pleural effusions bilaterally. ____________________________________________  DIFFERENTIAL DIAGNOSIS   A. fib, MI, unstable angina, electrolyte abnormality, medication noncompliance, medication side effect  FINAL ASSESSMENT AND PLAN  Atrial fibrillation with a rapid ventricular response, possible congestive heart failure   Plan: The patient had presented for rapid irregular heartbeat. Patient's labs did reveal elevated BNP and mildly elevated troponin likely rate related.  However I am concerned that he is developing congestive heart failure.  Patient's imaging revealed small pleural effusions.  I have given him oral Lasix as well as IV and oral Cardizem here.  Currently he is stable for admission.  He would benefit from repeat cardiac enzymes and echocardiogram.   Ulice Dash, MD   Note: This note was generated in part or whole with voice recognition software. Voice recognition is usually quite accurate but there are transcription errors that can and very often do occur. I apologize for any typographical errors that were not detected and corrected.     Emily Filbert, MD 12/08/18 2053    Emily Filbert, MD 12/08/18 2053

## 2018-12-08 NOTE — ED Notes (Signed)
Patient has had two episodes if afib before. Medication worked once and had to be shocked another.

## 2018-12-09 ENCOUNTER — Inpatient Hospital Stay
Admit: 2018-12-09 | Discharge: 2018-12-09 | Disposition: A | Discharge status: Home / Self Care | Payer: BLUE CROSS/BLUE SHIELD | Attending: Family Medicine | Admitting: Family Medicine

## 2018-12-09 LAB — TROPONIN I
Troponin I: 0.03 ng/mL (ref <0.03)
Troponin I: <0.03 ng/mL (ref <0.03)

## 2018-12-09 LAB — BASIC METABOLIC PANEL
Anion gap: 10 (ref 5–15)
BUN: 19 mg/dL (ref 6–20)
CO2: 20 mmol/L — ABNORMAL LOW (ref 22–32)
Calcium: 8.4 mg/dL — ABNORMAL LOW (ref 8.9–10.3)
Chloride: 102 mmol/L (ref 98–111)
Creatinine, Ser: 0.85 mg/dL (ref 0.61–1.24)
GFR calc Af Amer: >60 mL/min (ref >60)
Glucose, Bld: 270 mg/dL — ABNORMAL HIGH (ref 70–99)
Potassium: 4.2 mmol/L (ref 3.5–5.1)
Sodium: 132 mmol/L — ABNORMAL LOW (ref 135–145)

## 2018-12-09 LAB — ECHOCARDIOGRAM COMPLETE
Height: 74 in
Weight: 4003.2 oz

## 2018-12-09 LAB — URINE DRUG SCREEN, QUALITATIVE (ARMC ONLY)
Amphetamines, Ur Screen: NOT DETECTED
BENZODIAZEPINE, UR SCRN: NOT DETECTED
Barbiturates, Ur Screen: NOT DETECTED
Cannabinoid 50 Ng, Ur ~~LOC~~: NOT DETECTED
Cocaine Metabolite,Ur ~~LOC~~: NOT DETECTED
MDMA (Ecstasy)Ur Screen: NOT DETECTED
METHADONE SCREEN, URINE: NOT DETECTED
OPIATE, UR SCREEN: POSITIVE — AB
Phencyclidine (PCP) Ur S: NOT DETECTED
Tricyclic, Ur Screen: NOT DETECTED

## 2018-12-09 LAB — GLUCOSE, CAPILLARY
GLUCOSE-CAPILLARY: 151 mg/dL — AB (ref 70–99)
Glucose-Capillary: 215 mg/dL — ABNORMAL HIGH (ref 70–99)
Glucose-Capillary: 200 mg/dL — ABNORMAL HIGH (ref 70–99)
Glucose-Capillary: 159 mg/dL — ABNORMAL HIGH (ref 70–99)
Glucose-Capillary: 153 mg/dL — ABNORMAL HIGH (ref 70–99)

## 2018-12-09 MED ORDER — CARVEDILOL 6.25 MG PO TABS
6.2500 mg | ORAL_TABLET | Freq: Two times a day (BID) | ORAL | Status: DC
  Administered 2018-12-09: 6.25 mg via ORAL
  Filled 2018-12-09: qty 1

## 2018-12-09 MED ORDER — DILTIAZEM HCL ER COATED BEADS 120 MG PO CP24
240.0000 mg | ORAL_CAPSULE | Freq: Every day | ORAL | Status: DC
  Administered 2018-12-09: 240 mg via ORAL
  Filled 2018-12-09 (×2): qty 2

## 2018-12-09 MED ORDER — APIXABAN 5 MG PO TABS
5.0000 mg | ORAL_TABLET | Freq: Two times a day (BID) | ORAL | Status: DC
  Administered 2018-12-09 – 2018-12-10 (×3): 5 mg via ORAL
  Filled 2018-12-09 (×3): qty 1

## 2018-12-09 NOTE — Progress Notes (Signed)
New IV inserted on the left upper arm and IV azythromycin started. Pt c/o pain at the IV site that was 10/10 and throbbing. IV stopped and flushed but pt still states it hurts. IV removed with minimal relief from patient. Pt became diaphoretic and anxious, he states he feels like he's having a panic attack. 0.25 mg xanax given and Vicodin given for arm pain. Pt stating that his chest was hurting after he got upset about his arm pain. Two SL nitroglycerin given with chest pain going from a 6 toa 3. Pt declined a third nitro. Pt is currently dozing off and appears sleepy.

## 2018-12-09 NOTE — Care Management Note (Signed)
Case Management Note  Patient Details  Name: [name] MRN: [id number] Date of Birth: 1961/01/24  Subjective/Objective:  Admitted to [facility] with the diagnosis of Atrial fibrillation. Lives with wife,Teresa [phone]). Sees Dr, Bradly Bienenstock as primary care physician. No home Health. No skilled facility. No home oxygen. Takes care of all basic activities of daily living himself.  No falls. Good-Fair appetite.  No procedures planned for today per Dr. Gwen Pounds. Cardizem drip will be tapered.                  Action/Plan: Received referral for [facility] pending physical therapy and occupational therapy, No needs identified at this time.   Expected Discharge Date:                  Expected Discharge Plan:     In-House Referral:   yes  Discharge planning Services   yes, if needed.  Post Acute Care Choice:    Choice offered to:     DME Arranged:    DME Agency:     HH Arranged:    HH Agency:     Status of Service:     If discussed at [employer] of Stay Meetings, dates discussed:    Additional Comments:  Gwenette Greet, RN MSN CCM Care Management [phone] 12/09/2018, 9:09 AM

## 2018-12-09 NOTE — Progress Notes (Signed)
[employer] -  at [facility]   PATIENT NAME: Raymond Gurneyimothy Elgersma    MR#:  [id number]  DATE OF BIRTH:  [birth year]  SUBJECTIVE:  Patient without complaint, cardiology input appreciated, echo noted  REVIEW OF SYSTEMS:  CONSTITUTIONAL: No fever, fatigue or weakness.  EYES: No blurred or double vision.  EARS, NOSE, AND THROAT: No tinnitus or ear pain.  RESPIRATORY: No cough, shortness of breath, wheezing or hemoptysis.  CARDIOVASCULAR: No chest pain, orthopnea, edema.  GASTROINTESTINAL: No nausea, vomiting, diarrhea or abdominal pain.  GENITOURINARY: No dysuria, hematuria.  ENDOCRINE: No polyuria, nocturia,  HEMATOLOGY: No anemia, easy bruising or bleeding SKIN: No rash or lesion. MUSCULOSKELETAL: No joint pain or arthritis.   NEUROLOGIC: No tingling, numbness, weakness.  PSYCHIATRY: No anxiety or depression.   ROS  DRUG ALLERGIES:   Allergies  Allergen Reactions  . Pseudoephedrine     REACTION: ? starts him in A fib    VITALS:  Blood pressure 113/74, pulse 91, temperature 98.4 F (36.9 C), resp. rate 19, height 6\' 2"  (1.88 m), weight 113.5 kg, SpO2 98 %.  PHYSICAL EXAMINATION:  GENERAL:  57 y.o.-year-old patient lying in the bed with no acute distress.  EYES: Pupils equal, round, reactive to light and accommodation. No scleral icterus. Extraocular muscles intact.  HEENT: Head atraumatic, normocephalic. Oropharynx and nasopharynx clear.  NECK:  Supple, no jugular venous distention. No thyroid enlargement, no tenderness.  LUNGS: Normal breath sounds bilaterally, no wheezing, rales,rhonchi or crepitation. No use of accessory muscles of respiration.  CARDIOVASCULAR: S1, S2 normal. No murmurs, rubs, or gallops.  ABDOMEN: Soft, nontender, nondistended. Bowel sounds present. No organomegaly or mass.  EXTREMITIES: No pedal edema, cyanosis, or clubbing.  NEUROLOGIC: Cranial nerves II through XII are intact. Muscle strength 5/5 in all extremities. Sensation intact.  Gait not checked.  PSYCHIATRIC: The patient is alert and oriented x 3.  SKIN: No obvious rash, lesion, or ulcer.   Physical Exam LABORATORY PANEL:   CBC Recent Labs  Lab 12/08/18 1910  WBC 8.5  HGB 14.5  HCT 45.0  PLT 114*   ------------------------------------------------------------------------------------------------------------------  Chemistries  Recent Labs  Lab 12/09/18 0132  NA 132*  K 4.2  CL 102  CO2 20*  GLUCOSE 270*  BUN 19  CREATININE 0.85  CALCIUM 8.4*   ------------------------------------------------------------------------------------------------------------------  Cardiac Enzymes Recent Labs  Lab 12/09/18 0132 12/09/18 0721  TROPONINI 0.03* <0.03   ------------------------------------------------------------------------------------------------------------------  RADIOLOGY:  Dg Chest 2 View  Result Date: 12/08/2018 CLINICAL DATA:  Chest tightness and shortness of breath for 2 days EXAM: CHEST - 2 VIEW COMPARISON:  None. FINDINGS: Cardiac shadow is enlarged. Mild aortic calcifications are noted. The lungs are well aerated bilaterally. Small effusions are noted posteriorly as well as some mild right lower lobe infiltrate. No acute bony abnormality is seen. IMPRESSION: Mild right lower lobe infiltrate as well as small pleural effusions bilaterally. Electronically Signed   By: Alcide CleverMark  Lukens M.D.   On: 12/08/2018 20:08    ASSESSMENT AND PLAN:  *Acute paroxysmal A. fib with RVR With associated mild congestive heart failure exacerbation, status post cardioversion in the past Secondary to medication side effect/Sudafed usage Continue telemetry, CEs  negative for ACS, echocardiogram noted for ejection fraction 20-25%, cardiology input appreciated-start Eliquis twice daily/continue Cardizem/beta-blocker therapy, TSH normal, continue lisinopril, statin therapy, and continue close medical monitoring   *Acute mild systolic congestive heart failure  sedation Secondary to above  Continue CHF protocol, lisinopril, Coreg, on Brilinta/Eliquis, statin therapy, strict I&O monitoring, daily weights,  IV Lasix for short course for now, echo cardiogram noted above, cardiology following, did rule out for ACS   *Acute right-sided pneumonia Ruled out Procalcitonin level normal Discontinue antibiotics  *Chronic diabetes mellitus type 2 Hold metformin, continue Januvia, slight scale insulin Accu-Cheks per routine  *Chronic benign essential hypertension Stable on home regiment which will be continued  *History of coronary artery disease status post stenting in 2016 Stable Continue lisinopril, beta-blocker therapy, Brilinta, statin therapy, nitrates as needed  Disposition Home once cleared by cardiology, 1-3 days  All the records are reviewed and case discussed with Care Management/Social Workerr. Management plans discussed with the patient, family and they are in agreement.  CODE STATUS: full  TOTAL TIME TAKING CARE OF THIS PATIENT: 35 minutes.     POSSIBLE D/C IN 1-3 DAYS, DEPENDING ON CLINICAL CONDITION.   Evelena Asa  M.D on 12/09/2018   Between 7am to 6pm - Pager - [phone]  After 6pm go to [email or web address] - password [email or web address] [facility]  [facility]  Office  [phone]  CC: Primary care physician; Arne Cleveland, MD  Note: This dictation was prepared with Dragon dictation along with smaller phrase technology. Any transcriptional errors that result from this process are unintentional.

## 2018-12-09 NOTE — Progress Notes (Signed)
No procedures planned for today. Patient can eat per Dr. Gwen Pounds. IV cardizem drip tapered down, will stop 1 hour after PO dose given per order. Will continue to monitor.

## 2018-12-09 NOTE — Progress Notes (Signed)
Per CCMD, patient had 5 beats of VT. Dr. Katheren Shams and Dr. Gwen Pounds notified. Patient is asymptomatic, sitting in bed, asking for a snack.

## 2018-12-09 NOTE — Consult Note (Signed)
[facility] Consultation Note  Patient ID: [name], MRN: [id number], DOB/AGE: [birth year] 57 y.o. Admit date: 12/08/2018   Date of Consult: 12/09/2018 Primary Physician: Arne Cleveland, MD Primary Cardiologist: Higinio Roger  Chief Complaint:  Chief Complaint  Patient presents with  . Chest Pain  . Atrial Fibrillation   Reason for Consult: Atrial fibrillation  HPI: 57 y.o. male with known cardiovascular disease status post previous inferior myocardial infarction PCI and stent placement diabetes with complication hypertension hyperlipidemia on appropriate medication management with significant concerns of palpitations and irregular heartbeat.  The patient has had paroxysmal nonvalvular atrial fibrillation in the past and has had a new onset of atrial fibrillation after Sudafed use.  Currently the patient has had some control of his heart rate with appropriate diltiazem and metoprolol.  The patient has not had any chest pain or elevation of troponin.  Current EKG shows atrial fibrillation with rapid ventricular rate.  Currently the patient is hemodynamically stable  Past Medical History:  Diagnosis Date  . Atrial fibrillation (HCC)   . Atrial fibrillation (HCC)   . Coronary artery disease 2016   1 stent  . Diabetes mellitus without complication (HCC)   . ED (erectile dysfunction)   . Hepatitis C   . Hypertension   . Obesity       Surgical History:  Past Surgical History:  Procedure Laterality Date  . CORONARY STENT PLACEMENT  2016     Home Meds: Prior to Admission medications   Medication Sig Start Date End Date Taking? Authorizing Provider  HYDROcodone-acetaminophen (NORCO) 5-325 MG tablet Take 1 tablet by mouth every 6 (six) hours as needed for up to 7 doses for severe pain. 06/26/18  Yes Merrily Brittle, MD  lisinopril (PRINIVIL,ZESTRIL) 20 MG tablet Take 1 tablet by mouth daily. 03/21/18 03/21/19 Yes [provider]  metFORMIN  (GLUCOPHAGE-XR) 500 MG 24 hr tablet Take 2 tablets by mouth daily after supper. 01/30/18 01/30/19 Yes [provider]  metoprolol tartrate (LOPRESSOR) 25 MG tablet Take 0.5 tablets by mouth 2 (two) times daily. 03/21/18 03/21/19 Yes [provider]  sitaGLIPtin (JANUVIA) 100 MG tablet Take 1 tablet by mouth daily. 01/30/18 01/30/19 Yes [provider]  ticagrelor (BRILINTA) 90 MG TABS tablet Take 1 tablet by mouth every 12 (twelve) hours. 03/21/18  Yes [provider]  atorvastatin (LIPITOR) 80 MG tablet Take 1 tablet by mouth daily. 06/16/18   [provider]  ondansetron (ZOFRAN ODT) 4 MG disintegrating tablet Take 1 tablet (4 mg total) by mouth every 8 (eight) hours as needed for nausea or vomiting. 06/26/18   Merrily Brittle, MD  sildenafil (VIAGRA) 25 MG tablet Take 1 tablet by mouth daily as needed. 05/29/18   [provider]  traMADol (ULTRAM) 50 MG tablet Take 1 tablet (50 mg total) by mouth every 8 (eight) hours as needed. 07/04/18   Tommie Sams, DO    Inpatient Medications:  . atorvastatin  80 mg Oral Daily  . enoxaparin (LOVENOX) injection  40 mg Subcutaneous Q24H  . furosemide  20 mg Intravenous BID  . insulin aspart  0-15 Units Subcutaneous TID WC  . insulin aspart  0-5 Units Subcutaneous QHS  . linagliptin  5 mg Oral Daily  . lisinopril  20 mg Oral Daily  . metoprolol tartrate  12.5 mg Oral BID  . sodium chloride flush  3 mL Intravenous Q12H  . ticagrelor  90 mg Oral Q12H   . sodium chloride    .  azithromycin 500 mg (12/09/18 0004)  . cefTRIAXone (ROCEPHIN)  IV 1 g (12/09/18 0000)  . diltiazem (CARDIZEM) infusion 7.5 mg/hr (12/09/18 0048)    Allergies:  Allergies  Allergen Reactions  . Pseudoephedrine     REACTION: ? starts him in A fib    Social History   Socioeconomic History  . Marital status: Married    Spouse name: Not on file  . Number of children: Not on file  . Years of education: Not on file  . Highest  education level: Not on file  Occupational History  . Not on file  Social Needs  . Financial resource strain: Not on file  . Food insecurity:    Worry: Not on file    Inability: Not on file  . Transportation needs:    Medical: Not on file    Non-medical: Not on file  Tobacco Use  . Smoking status: Current Every Day Smoker    Packs/day: 0.50    Types: Cigarettes  . Smokeless tobacco: Never Used  Substance and Sexual Activity  . Alcohol use: No  . Drug use: Never  . Sexual activity: Not on file  Lifestyle  . Physical activity:    Days per week: Not on file    Minutes per session: Not on file  . Stress: Not on file  Relationships  . Social connections:    Talks on phone: Not on file    Gets together: Not on file    Attends religious service: Not on file    Active member of club or organization: Not on file    Attends meetings of clubs or organizations: Not on file    Relationship status: Not on file  . Intimate partner violence:    Fear of current or ex partner: Not on file    Emotionally abused: Not on file    Physically abused: Not on file    Forced sexual activity: Not on file  Other Topics Concern  . Not on file  Social History Narrative  . Not on file     Family History  Problem Relation Age of Onset  . Other Mother 37       died from complications of gastric bypass surgery  . Thyroid disease Mother   . Alzheimer's disease Father   . COPD Father   . Hypertension Father   . Hyperlipidemia Father   . Heart disease Father      Review of Systems Positive for palpitations Negative for: General:  chills, fever, night sweats or weight changes.  Cardiovascular: PND orthopnea syncope dizziness  Dermatological skin lesions rashes Respiratory: Cough congestion Urologic: Frequent urination urination at night and hematuria Abdominal: negative for nausea, vomiting, diarrhea, bright red blood per rectum, melena, or hematemesis Neurologic: negative for visual  changes, and/or hearing changes  All other systems reviewed and are otherwise negative except as noted above.  Labs: Recent Labs    12/08/18 1910 12/09/18 0132  TROPONINI 0.03* 0.03*   Lab Results  Component Value Date   WBC 8.5 12/08/2018   HGB 14.5 12/08/2018   HCT 45.0 12/08/2018   MCV 91.3 12/08/2018   PLT 114 (L) 12/08/2018    Recent Labs  Lab 12/09/18 0132  NA 132*  K 4.2  CL 102  CO2 20*  BUN 19  CREATININE 0.85  CALCIUM 8.4*  GLUCOSE 270*   Lab Results  Component Value Date   CHOL 247 (H) 05/12/2010   HDL 39 (L) 05/12/2010   LDLCALC 178 (  H) 05/12/2010   TRIG 152 (H) 05/12/2010   No results found for: DDIMER  Radiology/Studies:  Dg Chest 2 View  Result Date: 12/08/2018 CLINICAL DATA:  Chest tightness and shortness of breath for 2 days EXAM: CHEST - 2 VIEW COMPARISON:  None. FINDINGS: Cardiac shadow is enlarged. Mild aortic calcifications are noted. The lungs are well aerated bilaterally. Small effusions are noted posteriorly as well as some mild right lower lobe infiltrate. No acute bony abnormality is seen. IMPRESSION: Mild right lower lobe infiltrate as well as small pleural effusions bilaterally. Electronically Signed   By: Alcide Clever M.D.   On: 12/08/2018 20:08    EKG: Atrial fibrillation with rapid ventricular rate  Weights: Filed Weights   12/08/18 1847 12/08/18 2241 12/09/18 0445  Weight: 97.1 kg 112.4 kg 113.5 kg     Physical Exam: Blood pressure 128/82, pulse 87, temperature 98.1 F (36.7 C), resp. rate 18, height 6\' 2"  (1.88 m), weight 113.5 kg, SpO2 97 %. Body mass index is 32.12 kg/m. General: Well developed, well nourished, in no acute distress. Head eyes ears nose throat: Normocephalic, atraumatic, sclera non-icteric, no xanthomas, nares are without discharge. No apparent thyromegaly and/or mass  Lungs: Normal respiratory effort.  no wheezes, no rales, no rhonchi.  Heart: Other with normal S1 S2. no murmur gallop, no rub, PMI is  normal size and placement, carotid upstroke normal without bruit, jugular venous pressure is normal Abdomen: Soft, non-tender, non-distended with normoactive bowel sounds. No hepatomegaly. No rebound/guarding. No obvious abdominal masses. Abdominal aorta is normal size without bruit Extremities: No edema. no cyanosis, no clubbing, no ulcers  Peripheral : 2+ bilateral upper extremity pulses, 2+ bilateral femoral pulses, 2+ bilateral dorsal pedal pulse Neuro: Alert and oriented. No facial asymmetry. No focal deficit. Moves all extremities spontaneously. Musculoskeletal: Normal muscle tone without kyphosis Psych:  Responds to questions appropriately with a normal affect.    Assessment: 57 year old male with diabetes hypertension hyperlipidemia coronary artery disease status post previous stent having paroxysmal nonvalvular atrial fibrillation with rapid ventricular rate without evidence of myocardial infarction or heart failure  Plan: 1.  Continue diltiazem drip and transition to diltiazem 240 mg orally for heart rate control and possible spontaneous conversion to normal sinus rhythm 2.  Anticoagulation with Eliquis 5 mg twice per day for further risk reduction in stroke with atrial fibrillation 3.  Discontinuation of aspirin but continuation of Brilinta for previous PCI and stent placement and further risk reduction cardiovascular event but decreasing the potential for bleeding complications 4.  Continue high intensity cholesterol therapy without change 5.  Given ambulation and follow for improvements of symptoms and possible discharge to home if able if heart rate controlled and no further significant symptoms for further adjustments of medication management after above  Signed, Lamar Blinks M.D. [facility] 12/09/2018, 7:38 AM

## 2018-12-09 NOTE — Progress Notes (Signed)
*  PRELIMINARY RESULTS* Echocardiogram 2D Echocardiogram has been performed.  [name] 12/09/2018, 10:40 AM

## 2018-12-10 LAB — BASIC METABOLIC PANEL
Anion gap: 8 (ref 5–15)
BUN: 26 mg/dL — ABNORMAL HIGH (ref 6–20)
CO2: 22 mmol/L (ref 22–32)
Calcium: 8.3 mg/dL — ABNORMAL LOW (ref 8.9–10.3)
Chloride: 106 mmol/L (ref 98–111)
Creatinine, Ser: 0.87 mg/dL (ref 0.61–1.24)
GFR calc Af Amer: >60 mL/min (ref >60)
GFR calc non Af Amer: >60 mL/min (ref >60)
Glucose, Bld: 162 mg/dL — ABNORMAL HIGH (ref 70–99)
Potassium: 4.1 mmol/L (ref 3.5–5.1)
Sodium: 136 mmol/L (ref 135–145)

## 2018-12-10 LAB — HIV ANTIBODY (ROUTINE TESTING W REFLEX): HIV Screen 4th Generation wRfx: NONREACTIVE

## 2018-12-10 LAB — GLUCOSE, CAPILLARY
Glucose-Capillary: 164 mg/dL — ABNORMAL HIGH (ref 70–99)
Glucose-Capillary: 169 mg/dL — ABNORMAL HIGH (ref 70–99)

## 2018-12-10 MED ORDER — LISINOPRIL 20 MG PO TABS: 2.5000 mg | ORAL_TABLET | Freq: Every day | ORAL | 0 refills | Status: DC

## 2018-12-10 MED ORDER — APIXABAN 5 MG PO TABS: 5.0000 mg | ORAL_TABLET | Freq: Two times a day (BID) | ORAL | 0 refills | Status: DC

## 2018-12-10 MED ORDER — METOPROLOL SUCCINATE ER 50 MG PO TB24
50.0000 mg | ORAL_TABLET | Freq: Every day | ORAL | Status: DC
  Administered 2018-12-10: 50 mg via ORAL
  Filled 2018-12-10: qty 1

## 2018-12-10 MED ORDER — METOPROLOL SUCCINATE ER 50 MG PO TB24: 50.0000 mg | ORAL_TABLET | Freq: Every day | ORAL | 0 refills | Status: DC

## 2018-12-10 MED ORDER — FUROSEMIDE 40 MG PO TABS: 40.0000 mg | ORAL_TABLET | Freq: Every day | ORAL | 0 refills | Status: DC

## 2018-12-10 MED ORDER — DILTIAZEM HCL ER COATED BEADS 240 MG PO CP24: 240.0000 mg | ORAL_CAPSULE | Freq: Every day | ORAL | 0 refills | Status: DC

## 2018-12-10 NOTE — Progress Notes (Signed)
[facility]  Patient: [name] / Admit Date: 12/08/2018 / Date of Encounter: 12/10/2018, 8:23 AM   Subjective: Patient feeling much better at this time with no evidence of congestive heart failure type symptoms.  Heart rate better controlled with combination of diltiazem and beta-blocker.  Patient has been started on Eliquis for further risk reduction in stroke with atrial fibrillation without bleeding complications. Echocardiogram showing global LV systolic dysfunction with ejection fraction of 25% consistent with tachycardic induced cardiomyopathy Review of Systems: Positive for: As of breath Negative for: Vision change, hearing change, syncope, dizziness, nausea, vomiting,diarrhea, bloody stool, stomach pain, cough, congestion, diaphoresis, urinary frequency, urinary pain,skin lesions, skin rashes Others previously listed  Objective: Telemetry: Atrial fibrillation with controlled ventricular rate Physical Exam: Blood pressure 102/77, pulse 83, temperature 97.6 F (36.4 C), temperature source Oral, resp. rate 18, height 6\' 2"  (1.88 m), weight 112.1 kg, SpO2 98 %. Body mass index is 31.73 kg/m. General: Well developed, well nourished, in no acute distress. Head: Normocephalic, atraumatic, sclera non-icteric, no xanthomas, nares are without discharge. Neck: No apparent masses Lungs: Normal respirations with no wheezes, no rhonchi, no rales , no crackles   Heart: irregular rate and rhythm, normal S1 S2, no murmur, no rub, no gallop, PMI is normal size and placement, carotid upstroke normal without bruit, jugular venous pressure normal Abdomen: Soft, non-tender, non-distended with normoactive bowel sounds. No hepatosplenomegaly. Abdominal aorta is normal size without bruit Extremities: Trace edema, no clubbing, no cyanosis, no ulcers,  Peripheral: 2+ radial, 2+ femoral, 2+ dorsal pedal pulses Neuro: Alert and oriented. Moves all extremities  spontaneously. Psych:  Responds to questions appropriately with a normal affect.   Intake/Output Summary (Last 24 hours) at 12/10/2018 0823 Last data filed at 12/09/2018 2314 Gross per 24 hour  Intake 724.53 ml  Output 1450 ml  Net -725.47 ml    Inpatient Medications:  . apixaban  5 mg Oral BID  . atorvastatin  80 mg Oral Daily  . diltiazem  240 mg Oral Daily  . furosemide  20 mg Intravenous BID  . insulin aspart  0-15 Units Subcutaneous TID WC  . insulin aspart  0-5 Units Subcutaneous QHS  . linagliptin  5 mg Oral Daily  . lisinopril  20 mg Oral Daily  . metoprolol succinate  50 mg Oral Daily  . sodium chloride flush  3 mL Intravenous Q12H  . ticagrelor  90 mg Oral Q12H   Infusions:  . sodium chloride Stopped (12/09/18 2314)  . azithromycin Stopped (12/09/18 2306)  . cefTRIAXone (ROCEPHIN)  IV 1 g (12/09/18 2314)    Labs: Recent Labs    12/09/18 0132 12/10/18 0353  NA 132* 136  K 4.2 4.1  CL 102 106  CO2 20* 22  GLUCOSE 270* 162*  BUN 19 26*  CREATININE 0.85 0.87  CALCIUM 8.4* 8.3*   No results for input(s): AST, ALT, ALKPHOS, BILITOT, PROT, ALBUMIN in the last 72 hours. Recent Labs    12/08/18 1910  WBC 8.5  HGB 14.5  HCT 45.0  MCV 91.3  PLT 114*   Recent Labs    12/08/18 1910 12/09/18 0132 12/09/18 0721 12/09/18 1326  TROPONINI 0.03* 0.03* <0.03 <0.03   Invalid input(s): POCBNP No results for input(s): HGBA1C in the last 72 hours.   Weights: Filed Weights   12/08/18 2241 12/09/18 0445 12/10/18 0643  Weight: 112.4 kg 113.5 kg 112.1 kg     Radiology/Studies:  Dg Chest 2 View  Result Date:  12/08/2018 CLINICAL DATA:  Chest tightness and shortness of breath for 2 days EXAM: CHEST - 2 VIEW COMPARISON:  None. FINDINGS: Cardiac shadow is enlarged. Mild aortic calcifications are noted. The lungs are well aerated bilaterally. Small effusions are noted posteriorly as well as some mild right lower lobe infiltrate. No acute bony abnormality is seen.  IMPRESSION: Mild right lower lobe infiltrate as well as small pleural effusions bilaterally. Electronically Signed   By: Alcide Clever M.D.   On: 12/08/2018 20:08     Assessment and Recommendation  57 y.o. male with known coronary artery disease status post previous PCI and stent placement as well as myocardial infarction hypertension hyperlipidemia diabetes and now with paroxysmal nonvalvular atrial fibrillation with rapid ventricular rate causing tachycardic induced cardiomyopathy now improved without worsening symptoms 1.  Continue heart rate control of atrial fibrillation with metoprolol and diltiazem with a goal heart rate between 60 and 90 bpm 2.  Continue anticoagulation for further risk reduction in stroke with atrial fibrillation using Eliquis 5 mg twice per day 3.  Single antiplatelet therapy using Brilinta for previous PCI and stent placement and abstain from aspirin due to concerns of bleeding complications with triple therapy 4.  ACE inhibitor for cardiomyopathy and hypertension control 5.  High intensity cholesterol therapy 6.  No further cardiac diagnostics necessary at this time 7.  If ambulating well with reasonable heart rate control and no further significant symptoms okay for discharged home with follow-up in 1 week for adjustments of medication management for above  Signed, Arnoldo Hooker M.D. FACC

## 2018-12-10 NOTE — Discharge Summary (Signed)
[facility] [place] at [facility]   PATIENT NAME: [name]    MR#:  [id number]  DATE OF BIRTH:  Apr 22, 1961  DATE OF ADMISSION:  12/08/2018 ADMITTING PHYSICIAN: Bertrum Sol, MD  DATE OF DISCHARGE: No discharge date for patient encounter.  PRIMARY CARE PHYSICIAN: Arne Cleveland, MD    ADMISSION DIAGNOSIS:  Pleural effusion [J90] Atrial fibrillation with rapid ventricular response (HCC) [I48.91] Longstanding persistent atrial fibrillation [I48.11]  DISCHARGE DIAGNOSIS:  Active Problems:   A-fib (HCC)   SECONDARY DIAGNOSIS:   Past Medical History:  Diagnosis Date  . Atrial fibrillation (HCC)   . Atrial fibrillation (HCC)   . Coronary artery disease 2016   1 stent  . Diabetes mellitus without complication (HCC)   . ED (erectile dysfunction)   . Hepatitis C   . Hypertension   . Obesity     HOSPITAL COURSE:   *Acute paroxysmal A. fib with RVR with mild heart failure exacerbation Controlled on current regiment Noted history of cardioversion in the past Secondary to medication side effect/Sudafed usage Admitted to telemetry floor, CEs  negative for ACS, echocardiogram noted for ejection fraction 20-25%, cardiology to see patient while in house-started on Eliquis twice daily/Cardizem/beta-blocker therapy/to follow-up in 1 week status post discharge for reevaluation in cardiology clinic, TSH normal, continued lisinopril, statin therapy, and patient did well    *Acute mild systolic congestive heart failure sedation Resolved Secondary to above  Treated on our CHF protocol, lisinopril, beta-blocker therapy, on Brilinta/Eliquis, statin therapy, IV Lasix, echo cardiogram noted above, cardiology  cardiology recommendations per above    *Acute right-sided pneumonia Ruled out Procalcitonin level normal Discontinued antibiotics  *Chronic diabetes mellitus type 2 Metformin discontinued given severe cardiomyopathy  Continue Januvia,  slight scale insulin Accu-Cheks per routine while in house  *Chronic benign essential hypertension Stable on current regiment  *History of coronary artery disease status post stenting in 2016 Stable Continued lisinopril, beta-blocker therapy, Brilinta, statin therapy, nitrates as needed  DISCHARGE CONDITIONS:   stable  CONSULTS OBTAINED:    DRUG ALLERGIES:   Allergies  Allergen Reactions  . Pseudoephedrine     REACTION: ? starts him in A fib    DISCHARGE MEDICATIONS:   Allergies as of 12/10/2018      Reactions   Pseudoephedrine    REACTION: ? starts him in A fib      Medication List    STOP taking these medications   metFORMIN 500 MG 24 hr tablet Commonly known as:  GLUCOPHAGE-XR   metoprolol tartrate 25 MG tablet Commonly known as:  LOPRESSOR   sildenafil 25 MG tablet Commonly known as:  VIAGRA     TAKE these medications   apixaban 5 MG Tabs tablet Commonly known as:  ELIQUIS Take 1 tablet (5 mg total) by mouth 2 (two) times daily.   atorvastatin 80 MG tablet Commonly known as:  LIPITOR Take 1 tablet by mouth daily.   BRILINTA 90 MG Tabs tablet Generic drug:  ticagrelor Take 1 tablet by mouth every 12 (twelve) hours.   diltiazem 240 MG 24 hr capsule Commonly known as:  CARDIZEM CD Take 1 capsule (240 mg total) by mouth daily.   furosemide 40 MG tablet Commonly known as:  LASIX Take 1 tablet (40 mg total) by mouth daily.   HYDROcodone-acetaminophen 5-325 MG tablet Commonly known as:  NORCO Take 1 tablet by mouth every 6 (six) hours as needed for up to 7 doses for severe pain.   JANUVIA 100  MG tablet Generic drug:  sitaGLIPtin Take 1 tablet by mouth daily.   lisinopril 20 MG tablet Commonly known as:  PRINIVIL,ZESTRIL Take 0.5 tablets (10 mg total) by mouth daily. What changed:  how much to take   metoprolol succinate 50 MG 24 hr tablet Commonly known as:  TOPROL-XL Take 1 tablet (50 mg total) by mouth daily. Take with or immediately  following a meal.   ondansetron 4 MG disintegrating tablet Commonly known as:  ZOFRAN ODT Take 1 tablet (4 mg total) by mouth every 8 (eight) hours as needed for nausea or vomiting.   traMADol 50 MG tablet Commonly known as:  ULTRAM Take 1 tablet (50 mg total) by mouth every 8 (eight) hours as needed.        DISCHARGE INSTRUCTIONS:      If you experience worsening of your admission symptoms, develop shortness of breath, life threatening emergency, suicidal or homicidal thoughts you must seek medical attention immediately by calling 911 or calling your MD immediately  if symptoms less severe.  You Must read complete instructions/literature along with all the possible adverse reactions/side effects for all the Medicines you take and that have been prescribed to you. Take any new Medicines after you have completely understood and accept all the possible adverse reactions/side effects.   Please note  You were cared for by a hospitalist during your hospital stay. If you have any questions about your discharge medications or the care you received while you were in the hospital after you are discharged, you can call the unit and asked to speak with the hospitalist on call if the hospitalist that took care of you is not available. Once you are discharged, your primary care physician will handle any further medical issues. Please note that NO REFILLS for any discharge medications will be authorized once you are discharged, as it is imperative that you return to your primary care physician (or establish a relationship with a primary care physician if you do not have one) for your aftercare needs so that they can reassess your need for medications and monitor your lab values.    Today   CHIEF COMPLAINT:   Chief Complaint  Patient presents with  . Chest Pain  . Atrial Fibrillation    HISTORY OF PRESENT ILLNESS:   57 y.o. male with a known history A. fib-exacerbated by Sudafed, took  Sudafed yesterday and the day before, complaining of palpitations, shortness of breath chest discomfort, lower extremity edema, in the emergency room patient was found to be in A. fib with RVR with heart rate in the 140s, chest x-ray noted for right-sided pneumonia, BNP greater than 500, patient evaluated in the emergency room, no apparent distress, resting comfortably in bed, patient now being admitted for acute paroxysmal A. fib most likely secondary to Sudafed usage, and mild congestive heart failure exacerbation.  VITAL SIGNS:  Blood pressure 102/77, pulse 83, temperature 97.6 F (36.4 C), temperature source Oral, resp. rate 18, height 6\' 2"  (1.88 m), weight 112.1 kg, SpO2 98 %.  I/O:    Intake/Output Summary (Last 24 hours) at 12/10/2018 1036 Last data filed at 12/09/2018 2314 Gross per 24 hour  Intake 321.34 ml  Output 1450 ml  Net -1128.66 ml    PHYSICAL EXAMINATION:  GENERAL:  57 y.o.-year-old patient lying in the bed with no acute distress.  EYES: Pupils equal, round, reactive to light and accommodation. No scleral icterus. Extraocular muscles intact.  HEENT: Head atraumatic, normocephalic. Oropharynx and nasopharynx clear.  NECK:  Supple, no jugular venous distention. No thyroid enlargement, no tenderness.  LUNGS: Normal breath sounds bilaterally, no wheezing, rales,rhonchi or crepitation. No use of accessory muscles of respiration.  CARDIOVASCULAR: S1, S2 normal. No murmurs, rubs, or gallops.  ABDOMEN: Soft, non-tender, non-distended. Bowel sounds present. No organomegaly or mass.  EXTREMITIES: No pedal edema, cyanosis, or clubbing.  NEUROLOGIC: Cranial nerves II through XII are intact. Muscle strength 5/5 in all extremities. Sensation intact. Gait not checked.  PSYCHIATRIC: The patient is alert and oriented x 3.  SKIN: No obvious rash, lesion, or ulcer.   DATA REVIEW:   CBC Recent Labs  Lab 12/08/18 1910  WBC 8.5  HGB 14.5  HCT 45.0  PLT 114*    Chemistries   Recent Labs  Lab 12/10/18 0353  NA 136  K 4.1  CL 106  CO2 22  GLUCOSE 162*  BUN 26*  CREATININE 0.87  CALCIUM 8.3*    Cardiac Enzymes Recent Labs  Lab 12/09/18 1326  TROPONINI <0.03    Microbiology Results  No results found for this or any previous visit.  RADIOLOGY:  Dg Chest 2 View  Result Date: 12/08/2018 CLINICAL DATA:  Chest tightness and shortness of breath for 2 days EXAM: CHEST - 2 VIEW COMPARISON:  None. FINDINGS: Cardiac shadow is enlarged. Mild aortic calcifications are noted. The lungs are well aerated bilaterally. Small effusions are noted posteriorly as well as some mild right lower lobe infiltrate. No acute bony abnormality is seen. IMPRESSION: Mild right lower lobe infiltrate as well as small pleural effusions bilaterally. Electronically Signed   By: Alcide Clever M.D.   On: 12/08/2018 20:08    EKG:   Orders placed or performed during the hospital encounter of 12/08/18  . ED EKG within 10 minutes  . ED EKG within 10 minutes  . EKG 12-Lead  . EKG 12-Lead  . EKG      Management plans discussed with the patient, family and they are in agreement.  CODE STATUS:     Code Status Orders  (From admission, onward)         Start     Ordered   12/08/18 2204  Full code  Continuous     12/08/18 2203        Code Status History    This patient has a current code status but no historical code status.      TOTAL TIME TAKING CARE OF THIS PATIENT: 40 minutes.    Evelena Asa Marky Buresh M.D on 12/10/2018 at 10:36 AM  Between 7am to 6pm - Pager - [phone]  After 6pm go to [email or web address] - password [email or web address] [facility]  [facility] [place]  Office  [phone]  CC: Primary care physician; Arne Cleveland, MD   Note: This dictation was prepared with Dragon dictation along with smaller phrase technology. Any transcriptional errors that result from this process are unintentional.

## 2018-12-10 NOTE — Discharge Instructions (Signed)
Angina ° °Angina is extreme discomfort in the chest, neck, arm, jaw or back. The discomfort is caused by a lack of blood in the middle layer of the heart wall (myocardium). °There are four types of angina: °· Stable angina. This is triggered by vigorous activity or exercise. It goes away when you rest or take angina medicine. °· Unstable angina. This is a warning sign and can lead to a heart attack (acute coronary syndrome). This is a medical emergency. Symptoms come at rest and last a long time. °· Microvascular angina. This affects the small coronary arteries. Symptoms include feeling tired and being short of breath. °· Prinzmetal or variant angina. This is caused by a tightening (spasm) of the arteries that go to your heart. °What are the causes? °This condition is caused by atherosclerosis. This is the buildup of fat and cholesterol (plaque) in your arteries. The plaque may narrow or block the artery. °Other causes include: °· Sudden tightening of the muscles of the arteries in the heart (coronary spasm). °· Small artery disease (microvascular dysfunction). °· Problems with any of your heart valves (heart valve disease). °· A tear in an artery in your heart (coronary artery dissection). °· Cardiomyopathy, or other heart disease. °What increases the risk? °You are more likely to develop this condition if you have: °· High cholesterol. °· High blood pressure. °· Diabetes. °· Family history of heart disease. °· Inactive (sedentary) lifestyle, or you do not exercise enough. °· Depression. °· Had radiation to the left side of your chest. °Other risk factors include: °· Using tobacco. °· Being obese. °· Eating a diet high in saturated fats. °· Being exposed to high stress or triggers of stress. °· Using drugs, such as cocaine. °Women have a greater risk for angina if: °· They are older than 55. °· They have gone through menopause (postmenopausal). °What are the signs or symptoms? °Common symptoms in both men and women  may include: °· Chest pain, which may: °? Feel like a crushing or squeezing in the chest, or a tightness, pressure, fullness, or heaviness in the chest. °? Last for more than a few minutes at a time, or it may stop and come back (recur) over the course of a few minutes. °· Pain in the neck, arm, jaw, or back. °· Unexplained heartburn or indigestion. °· Shortness of breath. °· Nausea. °· Sudden cold sweats. °Women and people with diabetes may have unusual (atypical) symptoms, such as: °· Fatigue. °· Unexplained feelings of nervousness or anxiety. °· Unexplained weakness. °· Dizziness or fainting. °How is this diagnosed? °This condition may be diagnosed based on: °· Your symptoms and medical history. °· Electrocardiogram (ECG) to measure the electrical activity in your heart. °· Blood tests. °· Stress test to look for signs of blockage when your heart is stressed. °· CT angiogram to examine your heart and the blood flow to it. °· Coronary angiogram to check your coronary arteries for blockage. °How is this treated? °Angina may be treated with: °· Medicines to: °? Prevent blood clots and heart attack. °? Relax blood vessels and improve blood flow to the heart (nitrates). °? Reduce blood pressure, improve the pumping action of the heart, and relax blood vessels that are spasming. °? Reduce cholesterol and help treat atherosclerosis. °· A procedure to widen a narrowed or blocked coronary artery (angioplasty). A mesh tube may be placed in a coronary artery to keep it open (coronary stenting). °· Surgery to allow blood to go around a blocked artery (  coronary artery bypass surgery). °Follow these instructions at home: °Medicines °· Take over-the-counter and prescription medicines only as told by your health care provider. °· Do not take the following medicines unless your health care provider approves: °? NSAIDs, such as ibuprofen, naproxen, or celecoxib. °? Vitamin supplements that contain vitamin A, vitamin E, or  both. °? Hormone replacement therapy that contains estrogen with or without progestin. °Eating and drinking ° °· Eat a heart-healthy diet. This includes plenty of fresh fruits and vegetables, whole grains, low-fat (lean) protein, and low-fat dairy products. °· Follow instructions from your health care provider about eating or drinking restrictions. °Activity °· Follow an exercise program approved by your health care provider. Join a cardiac rehabilitation program. °· Take a break when you feel fatigued. Plan rest periods in your daily activities. °Lifestyle ° °· Do not use any products that contain nicotine or tobacco, such as cigarettes and e-cigarettes. If you need help quitting, ask your health care provider. °· If your health care provider approves, limit alcohol intake to no more than 1 drink a day for women and 2 drinks a day for men. One drink equals 12 oz of beer, 5 oz of wine, or 1½ oz of hard liquor. °General instructions °· Maintain a healthy weight. °· Learn to manage stress. °· Keep your vaccinations up to date. Get the flu (influenza) vaccine every year. °· Talk to your health care provider if you feel depressed. Take a depression screening test to see if you are at risk for depression. °· Work with your health care provider to manage other health conditions, such as hypertension or diabetes. °· Keep all follow-up visits as told by your health care provider. This is important. °Get help right away if: °· You have pain in your chest, neck, arm, jaw, or back, and the pain: °? Lasts more than a few minutes. °? Is recurring. °? Is not relieved by taking medicines under the tongue (sublingual nitroglycerin). °? Increases in intensity or frequency. °· You have a lot of sweating without cause. °· You have unexplained: °? Heartburn or indigestion. °? Shortness of breath or difficulty breathing. °? Nausea or vomiting. °? Fatigue. °? Feelings of nervousness or anxiety. °? Weakness. °· You have sudden  light-headedness or dizziness. °· You faint. °These symptoms may represent a serious problem that is an emergency. Do not wait to see if the symptoms will go away. Get medical help right away. Call your local emergency services (911 in the [place] Do not drive yourself to the hospital. °Summary °· Angina is extreme discomfort in the chest, neck, or arm that is caused by a lack of blood in the heart wall. °· There are many symptoms of angina. They include chest pain or pain in the arms, neck, jaw, or back. °· Angina may be treated with behavioral changes, medicine, or surgery. °· Symptoms of angina may represent an emergency. Get medical help right away. Call your local emergency services (911 in the [place] Do not drive yourself to the hospital. °This information is not intended to replace advice given to you by your health care provider. Make sure you discuss any questions you have with your health care provider. °Document Released: 12/04/2005 Document Revised: 01/18/2018 Document Reviewed: 01/18/2018 °Elsevier Interactive Patient Education © 2019 [employer] ° °

## 2018-12-10 NOTE — Progress Notes (Signed)
Apixaban Education  Counseled patient on new apixaban start for atrial fibrillation. Patient is aware of adverse effects and indication as he has been on warfarin in the past. Discussed OTC interactions, how to take, indication, side effects, etc. Patient aware.  Mauri Reading, PharmD Pharmacy Resident  12/10/2018 11:27 AM

## 2018-12-24 NOTE — Progress Notes (Signed)
Patient ID: [name], male    DOB: 1961-05-18, 58 y.o.   MRN: [id number]  HPI  [name] is a 58 y/o male with a history of CAD, DM, HTN, atrial fibrillation, hepatitis, current tobacco use and chronic heart failure.   Echo report from 12/09/18 reviewed and showed an EF of 20-25% along with moderate Victor and a PA pressure of 40 mm Hg.   Admitted 12/16/18 due to acute HF. Initially given IV lasix and then transitioned to oral torsemide. Estimated dry weight is ~ 249 lbs. Cardiac MRI showed EF of 35-40% with mild RV dysfunction. Discharged after 3 days.  Admitted 12/08/18 due to atria fibrillation along with HF exacerbation. Cardiology consult obtained. Initially needed IV lasix and then transitioned to oral diuretics. Discharged after 2 days.   He presents today for his initial visit with a chief complaint of minimal fatigue upon moderate exertion. He describes this as having been present for several months. He has associated cough, pedal edema, anxiety and difficulty sleeping along with this. He denies any abdominal distention, palpitations, chest pain, shortness of breath, dizziness or weight gain. Is going to be speaking to his PCP regarding the panic attacks that he feels like he's been having as he recognizes that when he feels anxious, he feels short of breath which makes him more anxious and then more short of breath. Was on diltiazem previously which was stopped due to his low EF.   Past Medical History:  Diagnosis Date  . Atrial fibrillation (HCC)   . Atrial fibrillation (HCC)   . CHF (congestive heart failure) (HCC)   . Coronary artery disease 2016   1 stent  . Diabetes mellitus without complication (HCC)   . ED (erectile dysfunction)   . Hepatitis C   . Hypertension   . Obesity    Past Surgical History:  Procedure Laterality Date  . CORONARY STENT PLACEMENT  2016   Family History  Problem Relation Age of Onset  . Other Mother 37       died from complications of  gastric bypass surgery  . Thyroid disease Mother   . Alzheimer's disease Father   . COPD Father   . Hypertension Father   . Hyperlipidemia Father   . Heart disease Father    Social History   Tobacco Use  . Smoking status: Current Every Day Smoker    Packs/day: 0.50    Types: Cigarettes  . Smokeless tobacco: Never Used  Substance Use Topics  . Alcohol use: No   Allergies  Allergen Reactions  . Pseudoephedrine     REACTION: ? starts him in A fib   Prior to Admission medications   Medication Sig Start Date End Date Taking? Authorizing Provider  acetaminophen (TYLENOL) 500 MG tablet Take 500 mg by mouth every 6 (six) hours as needed for mild pain.   Yes [provider]  apixaban (ELIQUIS) 5 MG TABS tablet Take 1 tablet (5 mg total) by mouth 2 (two) times daily. 12/10/18  Yes Salary, Evelena Asa, MD  atorvastatin (LIPITOR) 80 MG tablet Take 1 tablet by mouth daily. 06/16/18  Yes [provider]  HYDROcodone-acetaminophen (NORCO) 5-325 MG tablet Take 1 tablet by mouth every 6 (six) hours as needed for up to 7 doses for severe pain. 06/26/18  Yes Merrily Brittle, MD  lisinopril (PRINIVIL,ZESTRIL) 20 MG tablet Take 0.5 tablets (10 mg total) by mouth daily. Patient taking differently: Take 10 mg by mouth daily.  12/10/18 12/10/19 Yes Salary,  Montell D, MD  metoprolol succinate (TOPROL-XL) 50 MG 24 hr tablet Take 1 tablet (50 mg total) by mouth daily. Take with or immediately following a meal. 12/10/18  Yes Salary, Montell D, MD  sildenafil (VIAGRA) 100 MG tablet Take 100 mg by mouth daily as needed for erectile dysfunction.   Yes [provider]  sitaGLIPtin (JANUVIA) 100 MG tablet Take 1 tablet by mouth daily. 01/30/18 01/30/19 Yes [provider]  ticagrelor (BRILINTA) 90 MG TABS tablet Take 1 tablet by mouth every 12 (twelve) hours. 03/21/18  Yes [provider]  torsemide (DEMADEX) 20 MG tablet Take 40 mg by mouth daily.   Yes [provider]    Review of Systems  Constitutional: Positive for fatigue. Negative for appetite change.  HENT: Positive for ear discharge (bleeding yesterday). Negative for congestion, rhinorrhea and sore throat.   Eyes: Negative.   Respiratory: Positive for cough. Negative for shortness of breath.   Cardiovascular: Positive for leg swelling. Negative for chest pain and palpitations.  Gastrointestinal: Negative for abdominal distention and abdominal pain.  Endocrine: Negative.   Genitourinary: Negative.   Musculoskeletal: Negative.   Skin: Negative.   Allergic/Immunologic: Negative.   Neurological: Negative for dizziness and light-headedness.  Hematological: Negative for adenopathy. Does not bruise/bleed easily.  Psychiatric/Behavioral: Positive for sleep disturbance (trouble sleeping recently). Negative for dysphoric mood. The patient is nervous/anxious (panic attacks).     Vitals:   12/25/18 0900  BP: 116/84  Pulse: (!) 101  Resp: 18  SpO2: 100%  Weight: 246 lb 8 oz (111.8 kg)  Height: 6\' 3"  (1.905 m)   Wt Readings from Last 3 Encounters:  12/25/18 246 lb 8 oz (111.8 kg)  12/10/18 247 lb 1.6 oz (112.1 kg)  07/04/18 249 lb (112.9 kg)   Lab Results  Component Value Date   CREATININE 0.87 12/10/2018   CREATININE 0.85 12/09/2018   CREATININE 0.92 12/08/2018    Physical Exam Vitals signs and nursing note reviewed.  Constitutional:      Appearance: Normal appearance.  HENT:     Head: Normocephalic and atraumatic.  Neck:     Musculoskeletal: Normal range of motion and neck supple.  Cardiovascular:     Rate and Rhythm: Tachycardia present. Rhythm irregular.  Pulmonary:     Effort: Pulmonary effort is normal.     Breath sounds: Normal breath sounds. No wheezing or rales.  Abdominal:     General: Abdomen is flat. There is no distension.     Palpations: Abdomen is soft.  Musculoskeletal:        General: No tenderness.     Right lower leg: Edema (2+ pitting) present.     Left  lower leg: Edema (2+ pitting) present.  Skin:    General: Skin is warm and dry.  Neurological:     General: No focal deficit present.     Mental Status: He is alert and oriented to person, place, and time.  Psychiatric:        Mood and Affect: Mood is anxious.        Behavior: Behavior normal.    Assessment & Plan:  1: Chronic heart failure with reduced ejection fraction- - NYHA class II - euvolemic today - weighing daily and he was reminded to call for an overnight weight gain of > 2 pounds or a weekly weight gain of >5 pounds - not adding salt to his food and has been reading food labels. Reviewed the importance of closely following a 2000mg  sodium  diet and written dietary information was given to him about this.  - saw cardiology (Paraschos) 01/29/18 - unsure if BP could tolerate changing his lisinopril to entresto - will increase his metoprolol succinate to 100mg  daily; hopefully this will also help his tachycardia - BNP on 12/16/18 was 2372.0 - PharmD reconciled medications with the patient - has not received his flu vaccine yet  2: HTN- - BP looks good today - saw PCP Bradly Bienenstock) 04/29/18 - BMP from 12/19/18 reviewed and showed sodium 138, potassium 3.4, creatinine 1.1 and GFR 74  3: DM-  - glucose yesterday at home was 147 - A1c 04/29/18 was 6.9%  4: Lymphedema- - stage 2 - normally wears compression socks daily but didn't wear them today; doesn't feel like his edema is much better and he was encouraged to continue putting them on every morning with removal at bedtime - hasn't been elevating his legs much during the day and he was instructed to elevate his legs when sitting for long periods of time - hasn't been able to exercise much due to his job and his anxiety - discussed lymphapress compression boots if edema persists  5: Tobacco use- - smokes 1/2 ppd of cigarettes and is wanting to quit in the future - started smoking in his 40's due to stress - complete cessation  discussed with him for 3 minutes  6: Panic attack- - is going to talk with his PCP to see if there's something he can take to help ease his anxiety - says that when he feels short of breath, it makes him anxious which then makes him feel more short of breath  Medication list was reviewed.  Return in 6 weeks or sooner for any questions/problems before then.

## 2018-12-25 ENCOUNTER — Ambulatory Visit: Payer: BLUE CROSS/BLUE SHIELD | Attending: Family | Admitting: Family

## 2018-12-25 ENCOUNTER — Encounter: Payer: Self-pay | Admitting: Family

## 2018-12-25 ENCOUNTER — Encounter: Payer: Self-pay | Admitting: Pharmacist

## 2018-12-25 DIAGNOSIS — E119 Type 2 diabetes mellitus without complications: Secondary | ICD-10-CM | POA: Diagnosis not present

## 2018-12-25 DIAGNOSIS — F17210 Nicotine dependence, cigarettes, uncomplicated: Secondary | ICD-10-CM | POA: Diagnosis not present

## 2018-12-25 DIAGNOSIS — I2510 Atherosclerotic heart disease of native coronary artery without angina pectoris: Secondary | ICD-10-CM | POA: Insufficient documentation

## 2018-12-25 DIAGNOSIS — I10 Essential (primary) hypertension: Secondary | ICD-10-CM | POA: Insufficient documentation

## 2018-12-25 DIAGNOSIS — I110 Hypertensive heart disease with heart failure: Secondary | ICD-10-CM | POA: Diagnosis not present

## 2018-12-25 DIAGNOSIS — Z720 Tobacco use: Secondary | ICD-10-CM | POA: Insufficient documentation

## 2018-12-25 DIAGNOSIS — I5022 Chronic systolic (congestive) heart failure: Secondary | ICD-10-CM | POA: Diagnosis present

## 2018-12-25 DIAGNOSIS — I890 Lymphedema, not elsewhere classified: Secondary | ICD-10-CM | POA: Insufficient documentation

## 2018-12-25 DIAGNOSIS — Z8249 Family history of ischemic heart disease and other diseases of the circulatory system: Secondary | ICD-10-CM | POA: Diagnosis not present

## 2018-12-25 DIAGNOSIS — I4891 Unspecified atrial fibrillation: Secondary | ICD-10-CM | POA: Diagnosis not present

## 2018-12-25 DIAGNOSIS — F410 Panic disorder [episodic paroxysmal anxiety] without agoraphobia: Secondary | ICD-10-CM | POA: Diagnosis not present

## 2018-12-25 DIAGNOSIS — Z955 Presence of coronary angioplasty implant and graft: Secondary | ICD-10-CM | POA: Diagnosis not present

## 2018-12-25 DIAGNOSIS — B1920 Unspecified viral hepatitis C without hepatic coma: Secondary | ICD-10-CM | POA: Insufficient documentation

## 2018-12-25 DIAGNOSIS — Z7901 Long term (current) use of anticoagulants: Secondary | ICD-10-CM | POA: Diagnosis not present

## 2018-12-25 DIAGNOSIS — E669 Obesity, unspecified: Secondary | ICD-10-CM | POA: Diagnosis not present

## 2018-12-25 DIAGNOSIS — Z79899 Other long term (current) drug therapy: Secondary | ICD-10-CM | POA: Insufficient documentation

## 2018-12-25 MED ORDER — METOPROLOL SUCCINATE ER 100 MG PO TB24: 100.0000 mg | ORAL_TABLET | Freq: Every day | ORAL | 3 refills | Status: AC

## 2018-12-25 NOTE — Patient Instructions (Addendum)
Continue weighing daily and call for an overnight weight gain of > 2 pounds or a weekly weight gain of >5 pounds.  Finish current metoprolol dose by taking 2 tablets daily which will equal 100mg  daily. Once you finish your current bottle, you will begin the 100mg  dose that I've sent in to the pharmacy.

## 2018-12-25 NOTE — Progress Notes (Signed)
[facility] - HEART FAILURE CLINIC - PHARMACIST COUNSELING NOTE   ASSESSMENT   [name] presents to heart failure clinic for routine follow up. He was admitted to [facility] in December 2019 due to AF (he states it was because he took a pseudoephedrine tablet) and discharged on diltiazem and metoprolol succinate for AF. He then had fluid retention and [facility]. When he was admitted there they stopped his diltiazem and he has been doing fairly well since then.    Adherence assessment   Patient is using pill box as an adherence strategy. 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 When he misses doses it's because he gets held up at work.    Do you ever forget to take your medication? [x] Yes (1) [] No (0)  Do you ever skip doses due to side effects? [] Yes (1) [x] No (0)  Do you have trouble affording your medicines? [] Yes (1) [x] No (0)  Are you ever unable to pick up your medication due to transportation difficulties? [] Yes (1) [x] No (0)  Do you ever stop taking your medications because you don't believe they are helping? [] Yes (1) [x] No (0)  Total score _1______       Guideline-Directed Medical Therapy/Evidence Based Medicine   ACE/ARB/ARNI: Yes   Beta Blocker: Yes   Aldosterone Antagonist: No Diuretic: Yes   Drug-Related Problem 1: Additional therapy needed. Heart rate is elevated today 101 beats per minute. D[facility]em [facility] this month. Metoprolol succinate dose is not at goal.   PLAN   DRP1: Increase metoprolol succinate to 100 mg po daily.    SUBJECTIVE   HPI: [name] with some fluid accumulation but it is getting better since [facility] switched him to torsemide. 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 HR needs better control.    Past Medical History:  Diagnosis Date  . Atrial fibrillation (HCC)   . Atrial fibrillation (HCC)   . Coronary artery disease 2016   1 stent  . Diabetes mellitus  without complication (HCC)   . ED (erectile dysfunction)   . Hepatitis C   . Hypertension   . Obesity       Current Outpatient Medications:  .  acetaminophen (TYLENOL) 500 MG tablet, Take 500 mg by mouth every 6 (six) hours as needed for mild pain., Disp: , Rfl:  .  apixaban (ELIQUIS) 5 MG TABS tablet, Take 1 tablet (5 mg total) by mouth 2 (two) times daily., Disp: 60 tablet, Rfl: 0 .  atorvastatin (LIPITOR) 80 MG tablet, Take 1 tablet by mouth daily., Disp: , Rfl:  .  HYDROcodone-acetaminophen (NORCO) 5-325 MG tablet, Take 1 tablet by mouth every 6 (six) hours as needed for up to 7 doses for severe pain., Disp: 7 tablet, Rfl: 0 .  lisinopril (PRINIVIL,ZESTRIL) 20 MG tablet, Take 0.5 tablets (10 mg total) by mouth daily. (Patient taking differently: Take 10 mg by mouth daily. ), Disp: 30 tablet, Rfl: 0 .  sildenafil (VIAGRA) 100 MG tablet, Take 100 mg by mouth daily as needed for erectile dysfunction., Disp: , Rfl:  .  sitaGLIPtin (JANUVIA) 100 MG tablet, Take 1 tablet by mouth daily., Disp: , Rfl:  .  ticagrelor (BRILINTA) 90 MG TABS tablet, Take 1 tablet by mouth every 12 (twelve) hours., Disp: , Rfl:  .  torsemide (DEMADEX) 20 MG tablet, Take 40 mg by mouth daily., Disp: , Rfl:     OBJECTIVE  BMP Latest Ref Rng & Units 12/10/2018 12/09/2018 12/08/2018  Glucose 70 - 99 mg/dL 884(Z) 660(Y) 301(S)  BUN 6 - 20 mg/dL 01(U) 19 18  Creatinine 0.61 - 1.24 mg/dL 9.32 3.55 7.32  Sodium 135 - 145 mmol/L 136 132(L) 135  Potassium 3.5 - 5.1 mmol/L 4.1 4.2 4.2  Chloride 98 - 111 mmol/L 106 102 100  CO2 22 - 32 mmol/L 22 20(L) 25  Calcium 8.9 - 10.3 mg/dL 8.3(L) 8.4(L) 9.1    Vital signs: HR 101, BP 116/84, weight (kg) 246.8  ECHO: Date 12/09/18, EF 20 to 25%, moderate MV/TV regurgitation  Cath: Date N/A, EF N/A   DRUGS TO AVOID IN HEART FAILURE  Drug or Class Mechanism  Analgesics . NSAIDs . COX-2 inhibitors . Glucocorticoids  Sodium and water retention, increased systemic vascular  resistance, decreased response to diuretics   Diabetes Medications . Metformin . Thiazolidinediones o Rosiglitazone (Avandia) o Pioglitazone (Actos) . DPP4 Inhibitors o Saxagliptin (Onglyza) o Sitagliptin (Januvia)   Lactic acidosis Possible calcium channel blockade   Unknown  Antiarrhythmics . Class I  o Flecainide o Disopyramide . Class III o Sotalol . Other o Dronedarone  Negative inotrope, proarrhythmic   Proarrhythmic, beta blockade  Negative inotrope  Antihypertensives . Alpha Blockers o Doxazosin . Calcium Channel Blockers o Diltiazem o Verapamil o Nifedipine . Central Alpha Adrenergics o Moxonidine . Peripheral Vasodilators o Minoxidil  Increases renin and aldosterone  Negative inotrope    Possible sympathetic withdrawal  Unknown  Anti-infective . Itraconazole . Amphotericin B  Negative inotrope Unknown  Hematologic . Anagrelide . Cilostazol   Possible inhibition of PD IV Inhibition of PD III causing arrhythmias  Neurologic/Psychiatric . Stimulants . Anti-Seizure Drugs o Carbamazepine o Pregabalin . Antidepressants o Tricyclics o Citalopram . Parkinsons o Bromocriptine o Pergolide o Pramipexole . Antipsychotics o Clozapine . Antimigraine o Ergotamine o Methysergide . Appetite suppressants . Bipolar o Lithium  Peripheral alpha and beta agonist activity  Negative inotrope and chronotrope Calcium channel blockade  Negative inotrope, proarrhythmic Dose-dependent QT prolongation  Excessive serotonin activity/valvular damage Excessive serotonin activity/valvular damage Unknown  IgE mediated hypersensitivy, calcium channel blockade  Excessive serotonin activity/valvular damage Excessive serotonin activity/valvular damage Valvular damage  Direct myofibrillar degeneration, adrenergic stimulation  Antimalarials . Chloroquine . Hydroxychloroquine Intracellular inhibition of lysosomal enzymes  Urologic Agents . Alpha  Blockers o Doxazosin o Prazosin o Tamsulosin o Terazosin  Increased renin and aldosterone  Adapted from Page RL, et al. "Drugs That May Cause or Exacerbate Heart Failure: A Scientific Statement from the American Heart  Association." Circulation 2016; 134:e32-e69. DOI: 10.1161/CIR.0000000000000426   COUNSELING POINTS/CLINICAL PEARLS  Metoprolol Succinate (Goal: 200 mg once daily)  Warn patient to avoid activities requiring mental alertness or coordination until drug effects are realized, as drug may cause dizziness. Tell patient planning major surgery with anesthesia to alert physician that drug is being used, as drug impairs ability of heart to respond to reflex adrenergic stimuli. Drug may cause diarrhea, fatigue, headache, or depression. Advise diabetic patient to carefully monitor blood glucose as drug may mask symptoms of hypoglycemia. Patient should take extended-release tablet with or immediately following meals. Counsel patient against sudden discontinuation of drug, as this may precipitate hypertension, angina, or myocardial infarction. In the event of a missed dose, counsel patient to skip the missed dose and maintain a regular dosing schedule.  Lisinopril (Goal: 20 to 40 mg once daily)  This drug may cause nausea, vomiting, dizziness, headache, or angioedema of face, lips, throat, or intestines.  Instruct patient to report signs/symptoms of hypotension, or a persistent cough.  Advise patient against sudden discontinuation of drug.  Torsemide  Side effects may include excessive urination.  Tell patient to report symptoms of ototoxicity.  Instruct patient to report lightheadedness or syncope.  Warn patient to avoid use of nonprescription NSAID products without first discussing it with their healthcare provider.   MEDICATION ADHERENCES TIPS AND STRATEGIES 1. Taking medication as prescribed improves patient outcomes in heart failure (reduces hospitalizations, improves symptoms,  increases survival) 2. Side effects of medications can be managed by decreasing doses, switching agents, stopping drugs, or adding additional therapy. Please let someone in the Heart Failure Clinic know if you have having bothersome side effects so we can modify your regimen. Do not alter your medication regimen without talking to [place].  3. Medication reminders can help patients remember to take drugs on time. If you are missing or forgetting doses you can try linking behaviors, using pill boxes, or an electronic reminder like an alarm on your phone or an app. Some people can also get automated phone calls as medication reminders.   Time spent: 10 minutes  Carola Frost, Pharm.D. Clinical Pharmacist 12/25/2018

## 2019-01-08 ENCOUNTER — Ambulatory Visit: Payer: BLUE CROSS/BLUE SHIELD | Admitting: Anesthesiology

## 2019-01-08 ENCOUNTER — Encounter
Admission: RE | Disposition: A | Discharge status: Home / Self Care | Payer: Self-pay | Source: Home / Self Care | Attending: Cardiology

## 2019-01-08 ENCOUNTER — Encounter: Payer: Self-pay | Admitting: Anesthesiology

## 2019-01-08 ENCOUNTER — Ambulatory Visit
Admission: RE | Admit: 2019-01-08 | Discharge: 2019-01-08 | Disposition: A | Discharge status: Home / Self Care | Payer: BLUE CROSS/BLUE SHIELD | Attending: Cardiology | Admitting: Cardiology

## 2019-01-08 DIAGNOSIS — K759 Inflammatory liver disease, unspecified: Secondary | ICD-10-CM | POA: Insufficient documentation

## 2019-01-08 DIAGNOSIS — Z8249 Family history of ischemic heart disease and other diseases of the circulatory system: Secondary | ICD-10-CM | POA: Diagnosis not present

## 2019-01-08 DIAGNOSIS — Z79899 Other long term (current) drug therapy: Secondary | ICD-10-CM | POA: Insufficient documentation

## 2019-01-08 DIAGNOSIS — I252 Old myocardial infarction: Secondary | ICD-10-CM | POA: Insufficient documentation

## 2019-01-08 DIAGNOSIS — I480 Paroxysmal atrial fibrillation: Secondary | ICD-10-CM | POA: Insufficient documentation

## 2019-01-08 DIAGNOSIS — F17210 Nicotine dependence, cigarettes, uncomplicated: Secondary | ICD-10-CM | POA: Insufficient documentation

## 2019-01-08 DIAGNOSIS — Z6830 Body mass index (BMI) 30.0-30.9, adult: Secondary | ICD-10-CM | POA: Insufficient documentation

## 2019-01-08 DIAGNOSIS — E119 Type 2 diabetes mellitus without complications: Secondary | ICD-10-CM | POA: Insufficient documentation

## 2019-01-08 DIAGNOSIS — E669 Obesity, unspecified: Secondary | ICD-10-CM | POA: Diagnosis not present

## 2019-01-08 DIAGNOSIS — Z7984 Long term (current) use of oral hypoglycemic drugs: Secondary | ICD-10-CM | POA: Insufficient documentation

## 2019-01-08 DIAGNOSIS — Z955 Presence of coronary angioplasty implant and graft: Secondary | ICD-10-CM | POA: Insufficient documentation

## 2019-01-08 DIAGNOSIS — I5022 Chronic systolic (congestive) heart failure: Secondary | ICD-10-CM | POA: Insufficient documentation

## 2019-01-08 DIAGNOSIS — Z888 Allergy status to other drugs, medicaments and biological substances status: Secondary | ICD-10-CM | POA: Insufficient documentation

## 2019-01-08 DIAGNOSIS — Z79891 Long term (current) use of opiate analgesic: Secondary | ICD-10-CM | POA: Diagnosis not present

## 2019-01-08 DIAGNOSIS — Z7901 Long term (current) use of anticoagulants: Secondary | ICD-10-CM | POA: Diagnosis not present

## 2019-01-08 DIAGNOSIS — I2510 Atherosclerotic heart disease of native coronary artery without angina pectoris: Secondary | ICD-10-CM | POA: Insufficient documentation

## 2019-01-08 DIAGNOSIS — I110 Hypertensive heart disease with heart failure: Secondary | ICD-10-CM | POA: Diagnosis not present

## 2019-01-08 HISTORY — PX: CARDIOVERSION: EP1203

## 2019-01-08 SURGERY — CARDIOVERSION (CATH LAB)
Anesthesia: General

## 2019-01-08 MED ORDER — SODIUM CHLORIDE 0.9 % IV SOLN
INTRAVENOUS | Status: DC
  Administered 2019-01-08: 08:00:00 via INTRAVENOUS

## 2019-01-08 MED ORDER — SODIUM CHLORIDE 0.9 % IV BOLUS
250.0000 mL | Freq: Once | INTRAVENOUS | Status: AC
  Administered 2019-01-08: 250 mL via INTRAVENOUS

## 2019-01-08 MED ORDER — PROPOFOL 10 MG/ML IV BOLUS
INTRAVENOUS | Status: AC
  Filled 2019-01-08: qty 40

## 2019-01-08 MED ORDER — PROPOFOL 10 MG/ML IV BOLUS
INTRAVENOUS | Status: DC | PRN
  Administered 2019-01-08: 60 mg via INTRAVENOUS
  Administered 2019-01-08: 40 mg via INTRAVENOUS
  Administered 2019-01-08: 20 mg via INTRAVENOUS
  Administered 2019-01-08: 30 mg via INTRAVENOUS

## 2019-01-08 NOTE — Op Note (Signed)
[facility]   01/08/2019                     7:50 AM  PATIENT:  [name]    PRE-OPERATIVE DIAGNOSIS:  Cardioversion    Afib  POST-OPERATIVE DIAGNOSIS:  Same  PROCEDURE:  CARDIOVERSION (CATH LAB)  SURGEON:  Marcina Millard, MD    ANESTHESIA:     PREOPERATIVE INDICATIONS:  [name] is a  58 y.o. male with a diagnosis of Cardioversion    Afib who failed conservative measures and elected for surgical management.    The risks benefits and alternatives were discussed with the patient preoperatively including but not limited to the risks of infection, bleeding, cardiopulmonary complications, the need for revision surgery, among others, and the patient was willing to proceed.   OPERATIVE PROCEDURE: The patient was brought to the special procedures holding area in a fasting state.  He received 150 mg of propofol.  Electrical cardioversion was performed at 75 J, with successful conversion to sinus rhythm.  There were no periprocedural complications.

## 2019-01-08 NOTE — Anesthesia Post-op Follow-up Note (Signed)
Anesthesia QCDR form completed.        

## 2019-01-08 NOTE — Progress Notes (Signed)
Patient clinically stable post cardioversion with Dr Darrold Junker. Alert and oriented. Taking po's without difficulty. Daughter at bedside. Dr Darrold Junker out to speak with patient and daughter with questions answered. Discharge instructions given with questions answered.

## 2019-01-08 NOTE — Anesthesia Postprocedure Evaluation (Signed)
Anesthesia Post Note  Patient: [name]  Procedure(s) Performed: CARDIOVERSION (CATH LAB) (N/A )  Patient location during evaluation: Cath Lab Anesthesia Type: General Level of consciousness: awake and alert Pain management: pain level controlled Vital Signs Assessment: post-procedure vital signs reviewed and stable Respiratory status: spontaneous breathing, nonlabored ventilation, respiratory function stable and patient connected to nasal cannula oxygen Cardiovascular status: blood pressure returned to baseline and stable Postop Assessment: no apparent nausea or vomiting Anesthetic complications: no     Last Vitals:  Vitals:   01/08/19 0845 01/08/19 0900  BP: (!) 88/59 96/68  Pulse: 73 71  Resp: 13 15  Temp:    SpO2: 98% 99%    Last Pain:  Vitals:   01/08/19 0711  TempSrc: Oral                 Lenard Simmer

## 2019-01-08 NOTE — Anesthesia Preprocedure Evaluation (Addendum)
Anesthesia Evaluation  Patient identified by MRN, date of birth, ID band Patient awake    Reviewed: Allergy & Precautions, H&P , NPO status , Patient's Chart, lab work & pertinent test results, reviewed documented beta blocker date and time   History of Anesthesia Complications Negative for: history of anesthetic complications  Airway Mallampati: I  TM Distance: >3 FB Neck ROM: full    Dental  (+) Dental Advidsory Given, Missing, Poor Dentition   Pulmonary neg shortness of breath, neg sleep apnea, neg COPD, neg recent URI, Current Smoker,           Cardiovascular Exercise Tolerance: Good hypertension, (-) angina+ CAD, + Past MI, + Cardiac Stents and +CHF  (-) CABG + dysrhythmias Atrial Fibrillation (-) Valvular Problems/Murmurs     Neuro/Psych PSYCHIATRIC DISORDERS Anxiety negative neurological ROS     GI/Hepatic negative GI ROS, (+) Hepatitis - (s/p treatment), C  Endo/Other  diabetes  Renal/GU negative Renal ROS  negative genitourinary   Musculoskeletal   Abdominal   Peds  Hematology negative hematology ROS (+)   Anesthesia Other Findings Past Medical History: No date: Atrial fibrillation (HCC) No date: Atrial fibrillation (HCC) No date: CHF (congestive heart failure) (HCC) 2016: Coronary artery disease     Comment:  1 stent No date: Diabetes mellitus without complication (HCC) No date: ED (erectile dysfunction) No date: Hepatitis C No date: Hypertension No date: Obesity   Reproductive/Obstetrics negative OB ROS                            Anesthesia Physical Anesthesia Plan  ASA: III  Anesthesia Plan: General   Post-op Pain Management:    Induction: Intravenous  PONV Risk Score and Plan: 1 and Propofol infusion and TIVA  Airway Management Planned: Nasal Cannula and Natural Airway  Additional Equipment:   Intra-op Plan:   Post-operative Plan:   Informed Consent: I  have reviewed the patients History and Physical, chart, labs and discussed the procedure including the risks, benefits and alternatives for the proposed anesthesia with the patient or authorized representative who has indicated his/her understanding and acceptance.     Dental Advisory Given  Plan Discussed with: Anesthesiologist, CRNA and Surgeon  Anesthesia Plan Comments:         Anesthesia Quick Evaluation

## 2019-01-08 NOTE — Transfer of Care (Signed)
Immediate Anesthesia Transfer of Care Note  Patient: [name]  Procedure(s) Performed: CARDIOVERSION (CATH LAB) (N/A )  Patient Location: Short Stay  Anesthesia Type:General  Level of Consciousness: sedated  Airway & Oxygen Therapy: Patient connected to nasal cannula oxygen  Post-op Assessment: Post -op Vital signs reviewed and stable  Post vital signs: stable  Last Vitals:  Vitals Value Taken Time  BP    Temp    Pulse 66 01/08/2019  7:45 AM  Resp 12 01/08/2019  7:45 AM  SpO2      Last Pain:  Vitals:   01/08/19 0711  TempSrc: Oral         Complications: No apparent anesthesia complications

## 2019-01-08 NOTE — Progress Notes (Signed)
Patient remains stable,although sbp as charted. Patient alert and oriented,ie not symptomatic. Sinus rhythm per monitor. Consulted with Dr Kenney Houseman give second ns iv bolus per orders.

## 2019-01-08 NOTE — Discharge Instructions (Signed)
Electrical Cardioversion, Care After °This sheet gives you information about how to care for yourself after your procedure. Your health care provider may also give you more specific instructions. If you have problems or questions, contact your health care provider. °What can I expect after the procedure? °After the procedure, it is common to have: °· Some redness on the skin where the shocks were given. °Follow these instructions at home: ° °· Do not drive for 24 hours if you were given a medicine to help you relax (sedative). °· Take over-the-counter and prescription medicines only as told by your health care provider. °· Ask your health care provider how to check your pulse. Check it often. °· Rest for 48 hours after the procedure or as told by your health care provider. °· Avoid or limit your caffeine use as told by your health care provider. °Contact a health care provider if: °· You feel like your heart is beating too quickly or your pulse is not regular. °· You have a serious muscle cramp that does not go away. °Get help right away if: ° °· You have discomfort in your chest. °· You are dizzy or you feel faint. °· You have trouble breathing or you are short of breath. °· Your speech is slurred. °· You have trouble moving an arm or leg on one side of your body. °· Your fingers or toes turn cold or blue. °This information is not intended to replace advice given to you by your health care provider. Make sure you discuss any questions you have with your health care provider. °Document Released: 09/24/2013 Document Revised: 07/07/2016 Document Reviewed: 06/09/2016 °Elsevier Interactive Patient Education © 2019 [employer] ° °

## 2019-02-05 ENCOUNTER — Other Ambulatory Visit: Payer: Self-pay

## 2019-02-05 ENCOUNTER — Encounter: Payer: Self-pay | Admitting: Family

## 2019-02-05 ENCOUNTER — Ambulatory Visit: Payer: BLUE CROSS/BLUE SHIELD | Attending: Family | Admitting: Family

## 2019-02-05 VITALS — BP 120/73 | HR 63 | Temp 98.0°F | Resp 20 | Ht 75.0 in | Wt 224.2 lb

## 2019-02-05 DIAGNOSIS — Z7901 Long term (current) use of anticoagulants: Secondary | ICD-10-CM | POA: Diagnosis not present

## 2019-02-05 DIAGNOSIS — I5022 Chronic systolic (congestive) heart failure: Secondary | ICD-10-CM | POA: Diagnosis present

## 2019-02-05 DIAGNOSIS — Z79899 Other long term (current) drug therapy: Secondary | ICD-10-CM | POA: Insufficient documentation

## 2019-02-05 DIAGNOSIS — B1920 Unspecified viral hepatitis C without hepatic coma: Secondary | ICD-10-CM | POA: Insufficient documentation

## 2019-02-05 DIAGNOSIS — I4891 Unspecified atrial fibrillation: Secondary | ICD-10-CM | POA: Diagnosis not present

## 2019-02-05 DIAGNOSIS — I2510 Atherosclerotic heart disease of native coronary artery without angina pectoris: Secondary | ICD-10-CM | POA: Diagnosis not present

## 2019-02-05 DIAGNOSIS — I890 Lymphedema, not elsewhere classified: Secondary | ICD-10-CM | POA: Diagnosis not present

## 2019-02-05 DIAGNOSIS — I110 Hypertensive heart disease with heart failure: Secondary | ICD-10-CM | POA: Insufficient documentation

## 2019-02-05 DIAGNOSIS — Z8249 Family history of ischemic heart disease and other diseases of the circulatory system: Secondary | ICD-10-CM | POA: Insufficient documentation

## 2019-02-05 DIAGNOSIS — E119 Type 2 diabetes mellitus without complications: Secondary | ICD-10-CM | POA: Diagnosis not present

## 2019-02-05 DIAGNOSIS — Z955 Presence of coronary angioplasty implant and graft: Secondary | ICD-10-CM | POA: Diagnosis not present

## 2019-02-05 DIAGNOSIS — Z7984 Long term (current) use of oral hypoglycemic drugs: Secondary | ICD-10-CM | POA: Insufficient documentation

## 2019-02-05 DIAGNOSIS — F17210 Nicotine dependence, cigarettes, uncomplicated: Secondary | ICD-10-CM | POA: Insufficient documentation

## 2019-02-05 DIAGNOSIS — Z720 Tobacco use: Secondary | ICD-10-CM

## 2019-02-05 DIAGNOSIS — I10 Essential (primary) hypertension: Secondary | ICD-10-CM

## 2019-02-05 NOTE — Patient Instructions (Signed)
Continue weighing daily and call for an overnight weight gain of > 2 pounds or a weekly weight gain of >5 pounds. 

## 2019-02-05 NOTE — Progress Notes (Signed)
Patient ID: [name], male    DOB: Aug 20, 1961, 58 y.o.   MRN: [id number]  HPI  [name] is a 58 y/o male with a history of CAD, DM, HTN, atrial fibrillation, hepatitis, current tobacco use and chronic heart failure.   Echo report from 12/09/18 reviewed and showed an EF of 20-25% along with moderate Victor and a PA pressure of 40 mm Hg.   Admitted 12/16/18 due to acute HF. Initially given IV lasix and then transitioned to oral torsemide. Estimated dry weight is ~ 249 lbs. Cardiac MRI showed EF of 35-40% with mild RV dysfunction. Discharged after 3 days.  Admitted 12/08/18 due to atria fibrillation along with HF exacerbation. Cardiology consult obtained. Initially needed IV lasix and then transitioned to oral diuretics. Discharged after 2 days.   He presents today for a follow-up visit with a chief complaint of minimal fatigue upon moderate exertion. He describes this as chronic in nature having been present for several months. He has associated cough and anxiety along with this. He denies any difficulty sleeping, light-headedness, abdominal distention, palpitations, pedal edema, chest pain, shortness of breath or weight gain. Was cardioverted 01/08/2019 and has been feeling well since then.   Past Medical History:  Diagnosis Date  . Atrial fibrillation (HCC)   . Atrial fibrillation (HCC)   . CHF (congestive heart failure) (HCC)   . Coronary artery disease 2016   1 stent  . Diabetes mellitus without complication (HCC)   . ED (erectile dysfunction)   . Hepatitis C   . Hypertension   . Obesity    Past Surgical History:  Procedure Laterality Date  . CARDIOVERSION N/A 01/08/2019   Procedure: CARDIOVERSION (CATH LAB);  Surgeon: Victor Millard, [name];  Location: [facility] ORS;  Service: Cardiovascular;  Laterality: N/A;  . CORONARY STENT PLACEMENT  2016   Family History  Problem Relation Age of Onset  . Other Mother 37       died from complications of gastric bypass surgery  . Thyroid  disease Mother   . Alzheimer's disease Father   . COPD Father   . Hypertension Father   . Hyperlipidemia Father   . Heart disease Father    Social History   Tobacco Use  . Smoking status: Current Every Day Smoker    Packs/day: 0.50    Types: Cigarettes  . Smokeless tobacco: Never Used  Substance Use Topics  . Alcohol use: No   Allergies  Allergen Reactions  . Pseudoephedrine     starts him in A fib   Prior to Admission medications   Medication Sig Start Date End Date Taking? Authorizing Provider  apixaban (ELIQUIS) 5 MG TABS tablet Take 1 tablet (5 mg total) by mouth 2 (two) times daily. 12/10/18  Yes [name]  atorvastatin (LIPITOR) 80 MG tablet Take 80 mg by mouth every evening.  06/16/18  Yes Provider, Historical, [name]  HYDROcodone-acetaminophen (NORCO) 5-325 MG tablet Take 1 tablet by mouth every 6 (six) hours as needed for up to 7 doses for severe pain. Patient taking differently: Take 1 tablet by mouth daily as needed for severe pain.  06/26/18  Yes Victor Brittle, [name]  lisinopril (PRINIVIL,ZESTRIL) 20 MG tablet Take 0.5 tablets (10 mg total) by mouth daily. 12/10/18 12/10/19 Yes [name]  Menthol-Zinc Oxide (GOLD BOND EX) Apply 1 application topically daily as needed (dry skin).   Yes Provider, Historical, [name]  metoprolol succinate (TOPROL-XL) 100 MG 24 hr tablet Take 1 tablet (100  mg total) by mouth daily. Take with or immediately following a meal. 12/25/18 03/25/19 Yes [name]  sildenafil (VIAGRA) 100 MG tablet Take 100 mg by mouth daily as needed for erectile dysfunction.   Yes Provider, Historical, [name]  ticagrelor (BRILINTA) 90 MG TABS tablet Take 90 mg by mouth every 12 (twelve) hours.  03/21/18  Yes Provider, Historical, [name]  torsemide (DEMADEX) 20 MG tablet Take 40 mg by mouth daily.   Yes Provider, Historical, [name]  sitaGLIPtin (JANUVIA) 100 MG tablet Take 100 mg by mouth daily.  01/30/18 01/30/19  Provider, Historical, [name]    Review of Systems   Constitutional: Positive for fatigue. Negative for appetite change.  HENT: Negative for congestion, rhinorrhea and sore throat.   Eyes: Negative.   Respiratory: Positive for cough. Negative for shortness of breath.   Cardiovascular: Negative for chest pain, palpitations and leg swelling.  Gastrointestinal: Negative for abdominal distention and abdominal pain.  Endocrine: Negative.   Genitourinary: Negative.   Musculoskeletal: Negative.   Skin: Negative.   Allergic/Immunologic: Negative.   Neurological: Negative for dizziness and light-headedness.  Hematological: Negative for adenopathy. Does not bruise/bleed easily.  Psychiatric/Behavioral: Negative for dysphoric mood and sleep disturbance. The patient is nervous/anxious (panic attacks).    Vitals:   02/05/19 1154  BP: 120/73  Pulse: 63  Resp: 20  Temp: 98 F (36.7 C)  TempSrc: Oral  SpO2: 100%  Weight: 224 lb 3.2 oz (101.7 kg)  Height: 6\' 3"  (1.905 m)   Wt Readings from Last 3 Encounters:  02/05/19 224 lb 3.2 oz (101.7 kg)  01/08/19 240 lb (108.9 kg)  12/25/18 246 lb 8 oz (111.8 kg)   Lab Results  Component Value Date   CREATININE 0.87 12/10/2018   CREATININE 0.85 12/09/2018   CREATININE 0.92 12/08/2018    Physical Exam Vitals signs and nursing note reviewed.  Constitutional:      Appearance: Normal appearance.  HENT:     Head: Normocephalic and atraumatic.  Neck:     Musculoskeletal: Normal range of motion and neck supple.  Cardiovascular:     Rate and Rhythm: Regular rhythm. Bradycardia present.  Pulmonary:     Effort: Pulmonary effort is normal.     Breath sounds: Normal breath sounds. No wheezing or rales.  Abdominal:     General: Abdomen is flat. There is no distension.     Palpations: Abdomen is soft.  Musculoskeletal:        General: No tenderness.     Right lower leg: No edema.     Left lower leg: No edema.  Skin:    General: Skin is warm and dry.  Neurological:     General: No focal deficit  present.     Mental Status: He is alert and oriented to person, place, and time.  Psychiatric:        Mood and Affect: Mood is anxious.        Behavior: Behavior normal.    Assessment & Plan:  1: Chronic heart failure with reduced ejection fraction- - NYHA class II - euvolemic today - weighing daily and he was reminded to call for an overnight weight gain of > 2 pounds or a weekly weight gain of >5 pounds - weight down 22.6 pounds from last visit here 6 weeks ago - not adding salt to his food and has been reading food labels. Reminded to closely follow a 2000mg  sodium diet .  - saw cardiology ([facility]) 01/16/2019 - HR better since metoprolol  increased at last visit - BNP on 12/16/18 was 2372.0 - has not received his flu vaccine yet  2: HTN- - BP looks good today - saw PCP Bradly Bienenstock) 04/29/18 - BMP from 12/19/18 reviewed and showed sodium 138, potassium 3.4, creatinine 1.1 and GFR 74  3: DM-  - glucose at home has been "good" - A1c 04/29/18 was 6.9%  4: Lymphedema- - resolved  5: Tobacco use- - smokes 1/2 ppd of cigarettes and is wanting to quit in the future - started smoking in his 40's due to stress - complete cessation discussed with him for 3 minutes  Patient did not bring his medications nor a list. Each medication was verbally reviewed with the patient and he was encouraged to bring the bottles to every visit to confirm accuracy of list.  Will not make a return appointment at this time. Patient instructed to call back at anytime for any questions/ problems.

## 2019-05-08 IMAGING — CT CT ABD-PELV W/ CM
2 of 5 series · 16 of 46 positions shown, 18 images · IV contrast (APPLIED)
Comparison: 07/31/2008

CLINICAL DATA: Elevated WBC.  Umbilical pain.

EXAM:
CT ABDOMEN AND PELVIS WITH CONTRAST
TECHNIQUE: Multidetector CT imaging of the abdomen and pelvis was performed
using the standard protocol following bolus administration of
intravenous contrast.
CONTRAST:  100mL OMNIPAQUE IOHEXOL 300 MG/ML  SOLN

[Series 2: axial st · axial · 0.82mm/px · z∈[-1319,-839]mm · 13 of 108 slices shown, 15 images]
[im 6/108  soft-tissue]
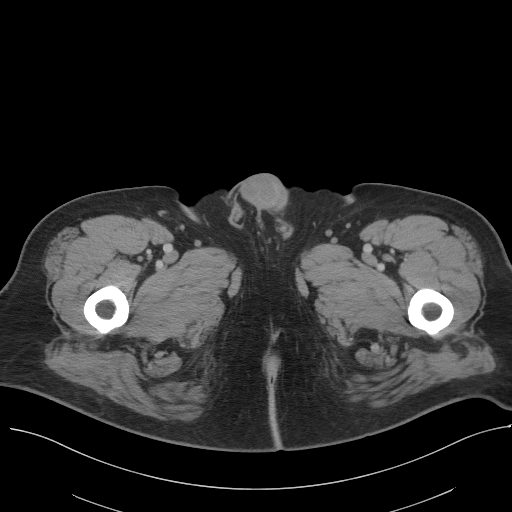
[im 6/108  bone]
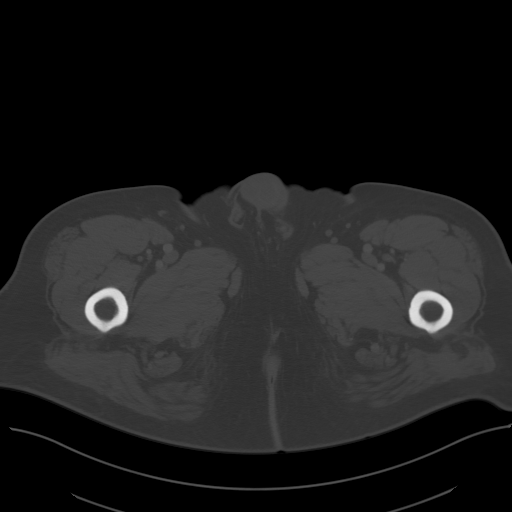
[im 17/108  soft-tissue]
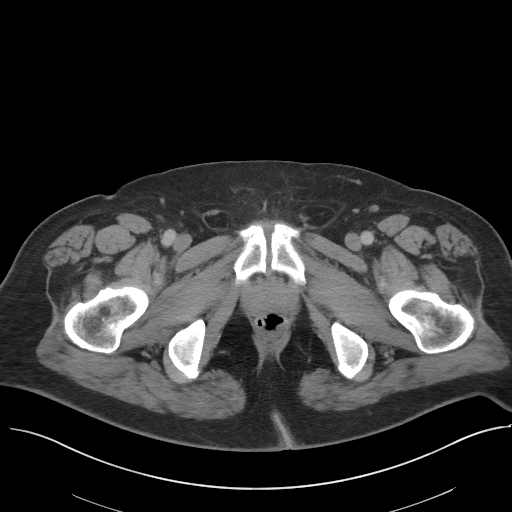
[im 22/108  soft-tissue]
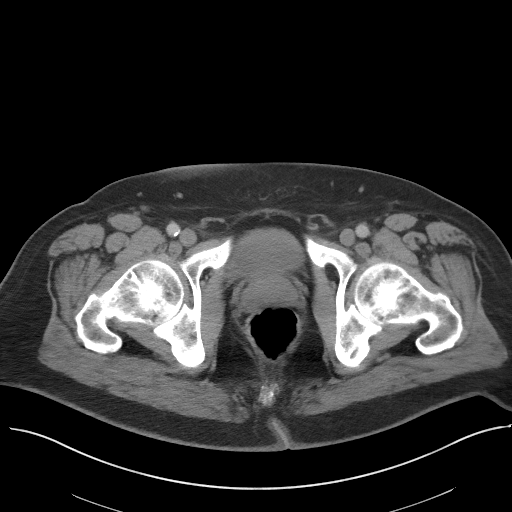
[im 33/108  soft-tissue]
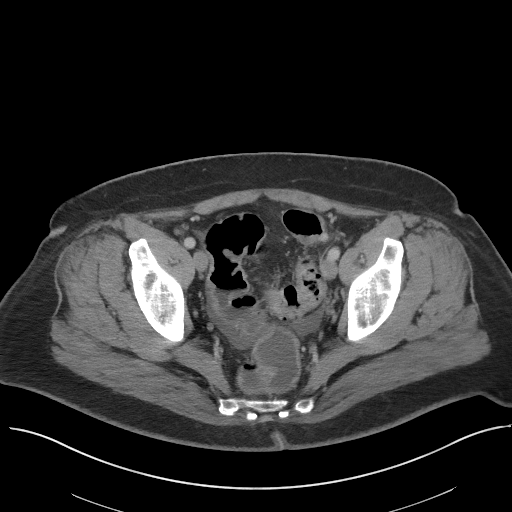
[im 38/108  soft-tissue]
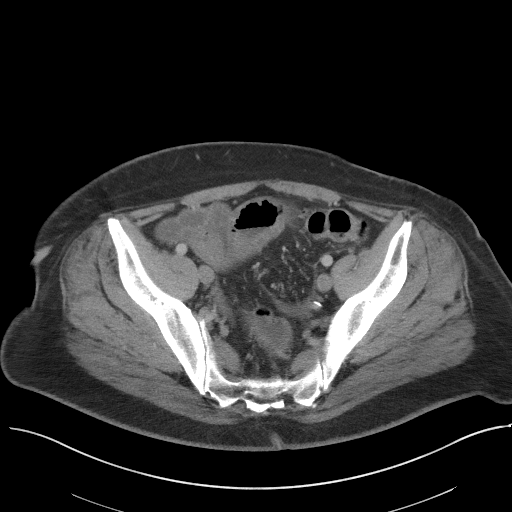
[im 49/108  soft-tissue]
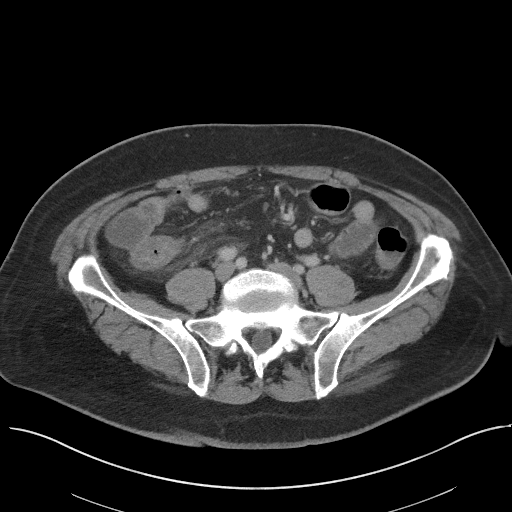
[im 54/108  soft-tissue]
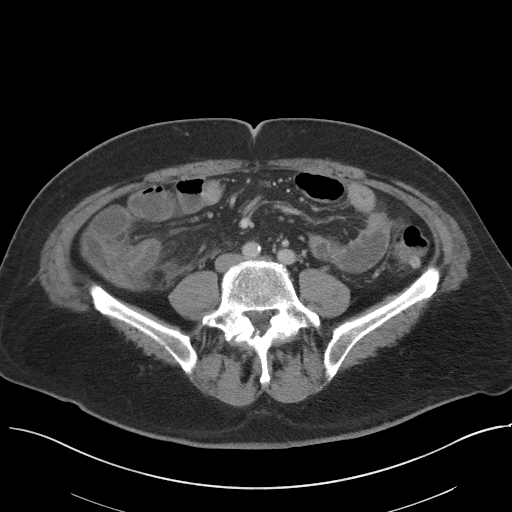
[im 59/108  soft-tissue]
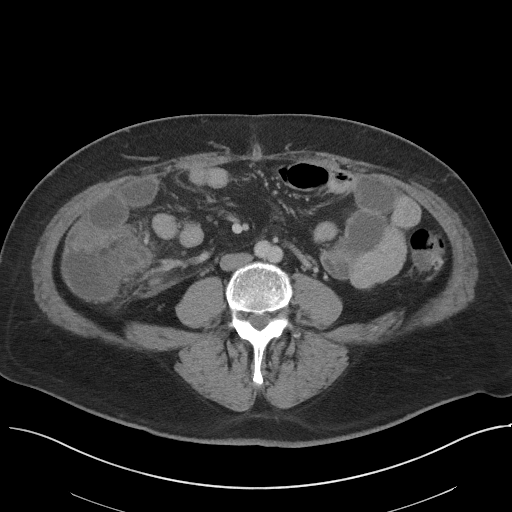
[im 70/108  soft-tissue]
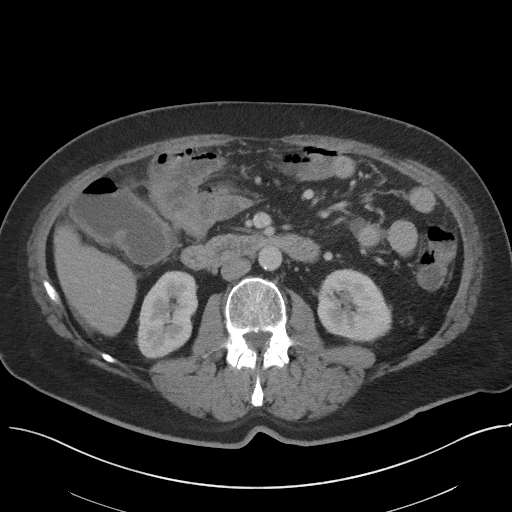
[im 70/108  bone]
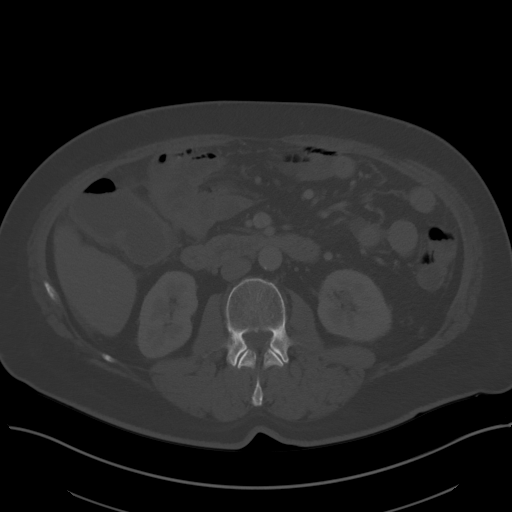
[im 75/108  soft-tissue]
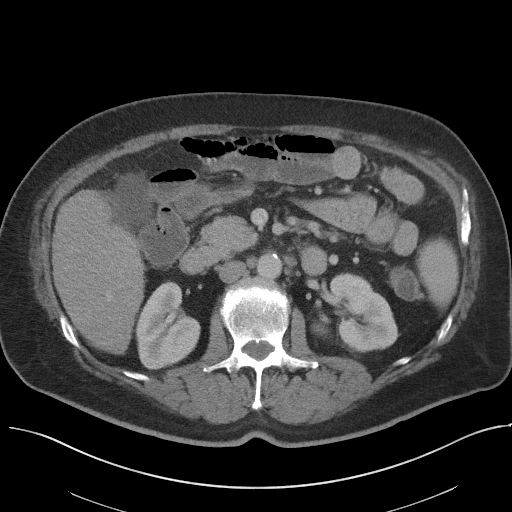
[im 86/108  soft-tissue]
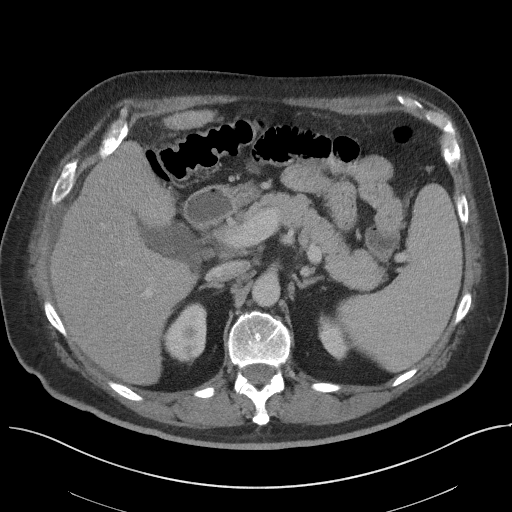
[im 91/108  soft-tissue]
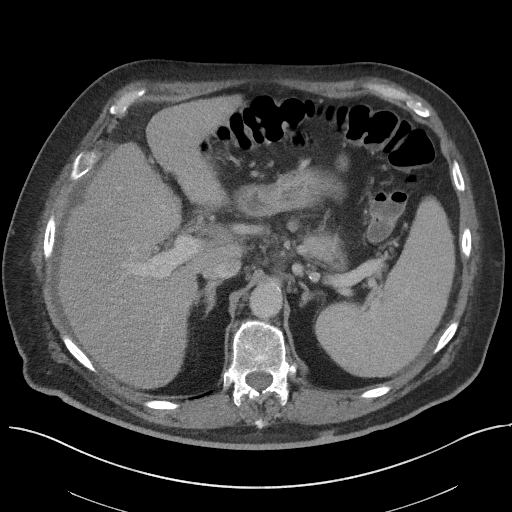
[im 102/108  soft-tissue]
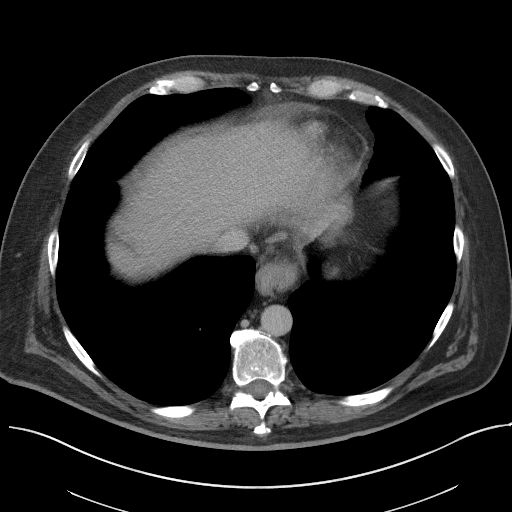

[Series 5: coronal st · coronal · 0.84mm/px · 3 of 110 slices shown]
[im 37/110  soft-tissue]
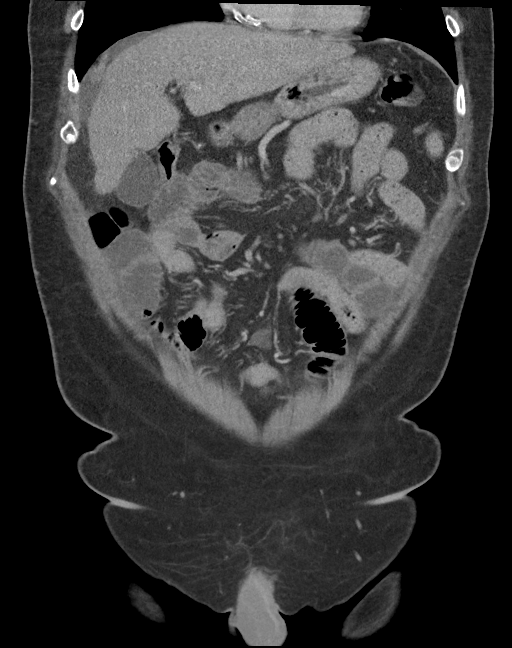
[im 49/110  soft-tissue]
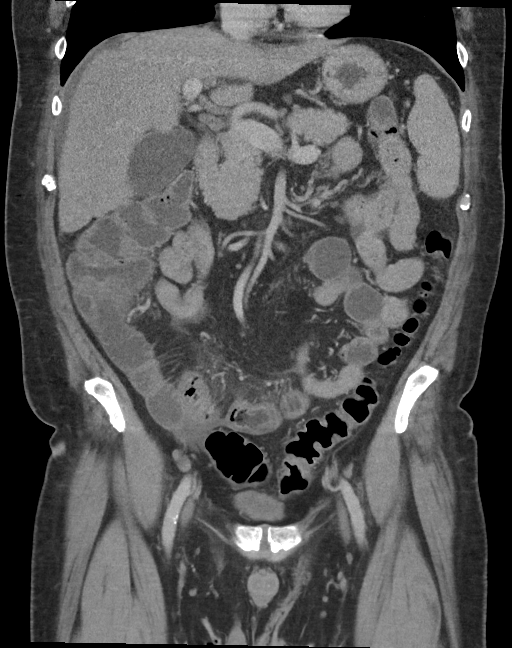
[im 61/110  soft-tissue]
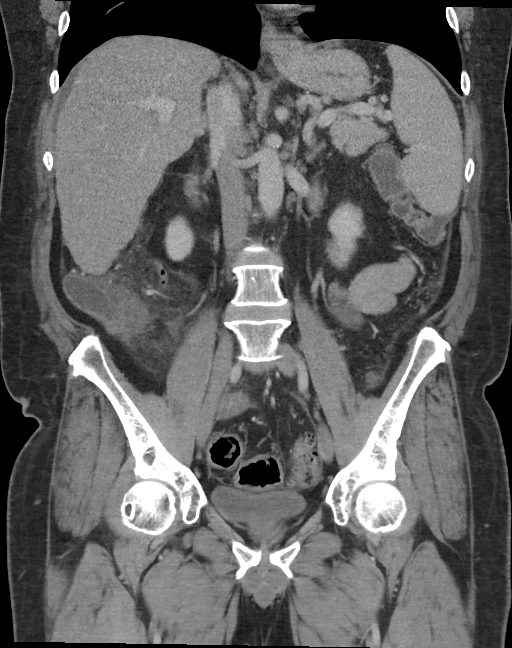

[16 of 46 positions shown; findings below may reference images not displayed]

FINDINGS: Lower chest: No acute abnormality.

Hepatobiliary: Slightly nodular contour of the liver with small
volume perihepatic ascites concerning for cirrhosis. No focal
hepatic mass. No intrahepatic biliary ductal dilatation. Normal
gallbladder.

Pancreas: Unremarkable. No pancreatic ductal dilatation or
surrounding inflammatory changes.

Spleen: Normal in size without focal abnormality.

Adrenals/Urinary Tract: Adrenal glands are unremarkable. Kidneys are
normal, without renal calculi, focal lesion, or hydronephrosis.
Bladder is unremarkable.

Stomach/Bowel: Small hiatal hernia. Appendix appears normal. Bowel
wall thickening of the distal ileum and terminal ileum. Fluid-filled
loops of small bowel and colon. No pneumatosis, pneumoperitoneum or
portal venous gas.

Vascular/Lymphatic: Aortic atherosclerosis. No enlarged abdominal or
pelvic lymph nodes.

Reproductive: Prostate is unremarkable.

Other: No fluid collection or hematoma. Small volume abdominal
ascites.

Musculoskeletal: No acute osseous abnormality. No aggressive osseous
lesion. Bilateral L5 pars interarticularis defects.
IMPRESSION: 1. Bowel wall thickening of the distal ileum and terminal ileum and
fluid-filled loops of small bowel and colon. Overall appearance is
most concerning for enterocolitis secondary to an infectious or
inflammatory etiology.
2. Findings concerning for cirrhosis.

## 2020-03-09 ENCOUNTER — Encounter: Payer: Self-pay | Admitting: Emergency Medicine

## 2020-03-09 ENCOUNTER — Ambulatory Visit
Admission: EM | Admit: 2020-03-09 | Discharge: 2020-03-09 | Disposition: A | Discharge status: Home / Self Care | Payer: BC Managed Care – PPO | Attending: Family Medicine | Admitting: Family Medicine

## 2020-03-09 ENCOUNTER — Other Ambulatory Visit: Payer: Self-pay

## 2020-03-09 DIAGNOSIS — S81809A Unspecified open wound, unspecified lower leg, initial encounter: Secondary | ICD-10-CM

## 2020-03-09 DIAGNOSIS — L03115 Cellulitis of right lower limb: Secondary | ICD-10-CM

## 2020-03-09 MED ORDER — DOXYCYCLINE HYCLATE 100 MG PO CAPS: 100.0000 mg | ORAL_CAPSULE | Freq: Two times a day (BID) | ORAL | 0 refills | Status: DC

## 2020-03-09 NOTE — ED Triage Notes (Signed)
Patient c/o infection to his right lower extremity that started 6 days ago. He states he has been having these infections since the summer when he stepped into a briar patch.

## 2020-03-09 NOTE — Discharge Instructions (Signed)
Antibiotics as prescribed.  I am placing a referral to wound care.  Take your medications.  Call Cardiology for an appt ASAP.  Take care  Dr. Adriana Simas

## 2020-03-10 ENCOUNTER — Telehealth: Payer: Self-pay | Admitting: Family Medicine

## 2020-03-10 NOTE — ED Provider Notes (Signed)
[facility]    CSN: [id number] Arrival date & time: 03/09/20  1850      History   Chief Complaint Chief Complaint  Patient presents with  . Recurrent Skin Infections   HPI  59 year old male presents with the above complaint.  Patient reports that for the past several months, when he gets a scratch or an injury to his lower extremities he develops an infection.  He states that the area is tender to swell and then he "pops them".  He is currently bothered by an area on his right lower extremity which is draining and which is erythematous.  He has ongoing lower extremity edema secondary to congestive heart failure and the fact that he is not compliant with his diuretic therapy.  He has multiple open wounds.  He is requesting antibiotic therapy at this time.  Denies fever.  Reports pain is 3/10 in severity.  No other complaints or concerns at this time.  Past Medical History:  Diagnosis Date  . Atrial fibrillation ([place])   . Atrial fibrillation ([place])   . CHF (congestive heart failure) ([place])   . Coronary artery disease 2016   1 stent  . Diabetes mellitus without complication ([place])   . ED (erectile dysfunction)   . Hepatitis C   . Hypertension   . Obesity     Patient Active Problem List   Diagnosis Date Noted  . Chronic systolic heart failure ([place]) 12/25/2018  . HTN (hypertension) 12/25/2018  . DM (diabetes mellitus) ([place]) 12/25/2018  . Lymphedema 12/25/2018  . Tobacco use 12/25/2018  . Panic attack 12/25/2018  . A-fib ([place]) 12/08/2018  . DYSLIPIDEMIA 05/12/2010  . HYPERTENSION, HEART CONTROLLED W/O ASSOC CHF 05/12/2010  . OVERWEIGHT/OBESITY 05/25/2009  . ATRIAL FIBRILLATION 05/25/2009    Past Surgical History:  Procedure Laterality Date  . CARDIOVERSION N/A 01/08/2019   Procedure: CARDIOVERSION (CATH LAB);  Surgeon: Isaias Cowman, MD;  Location: [facility] ORS;  Service: Cardiovascular;  Laterality: N/A;  . CORONARY STENT PLACEMENT  2016       Home  Medications    Prior to Admission medications   Medication Sig Start Date End Date Taking? Authorizing Provider  apixaban (ELIQUIS) 5 MG TABS tablet Take 1 tablet (5 mg total) by mouth 2 (two) times daily. 12/10/18  Yes Salary, Avel Peace, MD  atorvastatin (LIPITOR) 80 MG tablet Take 80 mg by mouth every evening.  06/16/18  Yes [provider]  HYDROcodone-acetaminophen (NORCO) 5-325 MG tablet Take 1 tablet by mouth every 6 (six) hours as needed for up to 7 doses for severe pain. Patient taking differently: Take 1 tablet by mouth daily as needed for severe pain.  06/26/18  Yes Darel Hong, MD  lisinopril (PRINIVIL,ZESTRIL) 20 MG tablet Take 0.5 tablets (10 mg total) by mouth daily. 12/10/18 03/09/20 Yes Salary, Avel Peace, MD  metoprolol succinate (TOPROL-XL) 100 MG 24 hr tablet Take 1 tablet (100 mg total) by mouth daily. Take with or immediately following a meal. 12/25/18 03/09/20 Yes Hackney, Tina A, FNP  sitaGLIPtin (JANUVIA) 100 MG tablet Take 100 mg by mouth daily.  01/30/18 03/09/20 Yes [provider]  ticagrelor (BRILINTA) 90 MG TABS tablet Take 90 mg by mouth every 12 (twelve) hours.  03/21/18  Yes [provider]  torsemide (DEMADEX) 20 MG tablet Take 40 mg by mouth daily.   Yes [provider]  doxycycline (VIBRAMYCIN) 100 MG capsule Take 1 capsule (100 mg total) by mouth 2 (two) times daily. 03/09/20   Lacinda Axon,  Neyla Gauntt G, DO  Menthol-Zinc Oxide (GOLD BOND EX) Apply 1 application topically daily as needed (dry skin).    [provider]  sildenafil (VIAGRA) 100 MG tablet Take 100 mg by mouth daily as needed for erectile dysfunction.    [provider]    Family History Family History  Problem Relation Age of Onset  . Other Mother 37       died from complications of gastric bypass surgery  . Thyroid disease Mother   . Alzheimer's disease Father   . COPD Father   . Hypertension Father   . Hyperlipidemia Father   . Heart disease Father      Social History Social History   Tobacco Use  . Smoking status: Current Every Day Smoker    Packs/day: 0.50    Types: Cigarettes  . Smokeless tobacco: Never Used  Substance Use Topics  . Alcohol use: No  . Drug use: Not Currently    Types: Marijuana    Comment: as a teen     Allergies   Pseudoephedrine   Review of Systems Review of Systems  Cardiovascular: Positive for leg swelling.  Skin: Positive for wound.   Physical Exam Triage Vital Signs ED Triage Vitals  Enc Vitals Group     BP 03/09/20 1917 131/90     Pulse Rate 03/09/20 1917 (!) 118     Resp 03/09/20 1917 18     Temp 03/09/20 1917 98.2 F (36.8 C)     Temp Source 03/09/20 1917 Oral     SpO2 03/09/20 1917 98 %     Weight 03/09/20 1916 240 lb (108.9 kg)     Height 03/09/20 1916 6\' 3"  (1.905 m)     Head Circumference --      Peak Flow --      Pain Score 03/09/20 1915 3     Pain Loc --      Pain Edu? --      Excl. in GC? --    No data found.  Updated Vital Signs BP 131/90 (BP Location: Left Arm)   Pulse (!) 118   Temp 98.2 F (36.8 C) (Oral)   Resp 18   Ht 6\' 3"  (1.905 m)   Wt 108.9 kg   SpO2 98%   BMI 30.00 kg/m   Visual Acuity Right Eye Distance:   Left Eye Distance:   Bilateral Distance:    Right Eye Near:   Left Eye Near:    Bilateral Near:     Physical Exam Vitals and nursing note reviewed.  Constitutional:      General: He is not in acute distress.    Appearance: Normal appearance. He is not ill-appearing.  HENT:     Head: Normocephalic and atraumatic.  Eyes:     General:        Right eye: No discharge.        Left eye: No discharge.     Conjunctiva/sclera: Conjunctivae normal.  Cardiovascular:     Rate and Rhythm: Regular rhythm. Tachycardia present.  Pulmonary:     Effort: Pulmonary effort is normal.     Breath sounds: Normal breath sounds. No wheezing, rhonchi or rales.  Skin:    Comments: Right and left lower extremities -patient has several wounds which are  draining.  Some wounds draining pus.  Areas of erythema and warmth. See pictures.  Neurological:     Mental Status: He is alert.  Psychiatric:        Mood and  Affect: Mood normal.        Behavior: Behavior normal.          UC Treatments / Results  Labs (all labs ordered are listed, but only abnormal results are displayed) Labs Reviewed - No data to display  EKG   Radiology No results found.  Procedures Procedures (including critical care time)  Medications Ordered in UC Medications - No data to display  Initial Impression / Assessment and Plan / UC Course  I have reviewed the triage vital signs and the nursing notes.  Pertinent labs & imaging results that were available during my care of the patient were reviewed by me and considered in my medical decision making (see chart for details).    59 year old male presents with cellulitis and several open wound with purulent discharge.  Placing on doxycycline.  Placing referral to wound care.  Final Clinical Impressions(s) / UC Diagnoses   Final diagnoses:  Cellulitis of right lower extremity  Open wounds involving multiple regions of lower extremity     Discharge Instructions     Antibiotics as prescribed.  I am placing a referral to wound care.  Take your medications.  Call Cardiology for an appt ASAP.  Take care  Dr. Adriana Simas    ED Prescriptions    Medication Sig Dispense Auth. Provider   doxycycline (VIBRAMYCIN) 100 MG capsule Take 1 capsule (100 mg total) by mouth 2 (two) times daily. 28 capsule Everlene Other G, DO     PDMP not reviewed this encounter.   Everlene Other [place], [place] 03/10/20 [phone]

## 2020-03-10 NOTE — Telephone Encounter (Signed)
Placing referral to wound care.  Everlene Other DO [facility]

## 2020-04-07 ENCOUNTER — Ambulatory Visit: Payer: BC Managed Care – PPO | Admitting: Internal Medicine

## 2020-04-12 ENCOUNTER — Ambulatory Visit: Payer: BC Managed Care – PPO | Admitting: Physician Assistant

## 2020-04-14 ENCOUNTER — Other Ambulatory Visit: Payer: Self-pay

## 2020-04-14 ENCOUNTER — Encounter: Payer: BC Managed Care – PPO | Attending: Internal Medicine | Admitting: Internal Medicine

## 2020-04-14 DIAGNOSIS — E11628 Type 2 diabetes mellitus with other skin complications: Secondary | ICD-10-CM | POA: Diagnosis present

## 2020-04-14 DIAGNOSIS — L02415 Cutaneous abscess of right lower limb: Secondary | ICD-10-CM | POA: Insufficient documentation

## 2020-04-14 DIAGNOSIS — F17200 Nicotine dependence, unspecified, uncomplicated: Secondary | ICD-10-CM | POA: Insufficient documentation

## 2020-04-14 DIAGNOSIS — I4891 Unspecified atrial fibrillation: Secondary | ICD-10-CM | POA: Insufficient documentation

## 2020-04-14 DIAGNOSIS — I110 Hypertensive heart disease with heart failure: Secondary | ICD-10-CM | POA: Diagnosis not present

## 2020-04-14 DIAGNOSIS — L02416 Cutaneous abscess of left lower limb: Secondary | ICD-10-CM | POA: Insufficient documentation

## 2020-04-14 DIAGNOSIS — I509 Heart failure, unspecified: Secondary | ICD-10-CM | POA: Insufficient documentation

## 2020-04-14 NOTE — Progress Notes (Signed)
[name], [name] ([id number]) Visit Report for 04/14/2020 Abuse/Suicide Risk Screen Details Patient Name: [name], [name] Date of Service: 04/14/2020 1:00 PM Medical Record Number: [id number] Patient Account Number: [id number] Date of Birth/Sex: September 23, 1961 (58 y.o. M) Treating RN: Army Melia Primary Care Kaion Tisdale: PATIENT, NO Other Clinician: Referring Gerturde Kuba: Thersa Salt Treating Deiondra Denley/Extender: Tito Dine in Treatment: 0 Abuse/Suicide Risk Screen Items Answer ABUSE RISK SCREEN: Has anyone close to you tried to hurt or harm you recentlyo No Do you feel uncomfortable with anyone in your familyo No Has anyone forced you do things that you didnot want to doo No Electronic Signature(s) Signed: 04/14/2020 2:17:21 PM By: Army Melia Entered By: Army Melia on 04/14/2020 13:32:39 [name], [name] ([id number]) -------------------------------------------------------------------------------- Activities of Daily Living Details Patient Name: [name] Date of Service: 04/14/2020 1:00 PM Medical Record Number: [id number] Patient Account Number: [id number] Date of Birth/Sex: September 24, 1961 (58 y.o. M) Treating RN: Army Melia Primary Care Tiera Mensinger: PATIENT, NO Other Clinician: Referring Virgle Arth: Thersa Salt Treating Arlenis Blaydes/Extender: Tito Dine in Treatment: 0 Activities of Daily Living Items Answer Activities of Daily Living (Please select one for each item) Drive Automobile Completely Able Take Medications Completely Able Use Telephone Completely Able Care for Appearance Completely Able Use Toilet Completely Able Bath / Shower Completely Able Dress Self Completely Able Feed Self Completely Able Walk Completely Able Get In / Out Bed Completely Able Housework Completely Able Prepare Meals Completely [place] for Self Completely Able Electronic Signature(s) Signed: 04/14/2020 2:17:21 PM By: Army Melia Entered  By: Army Melia on 04/14/2020 13:32:48 [name], [name] ([id number]) -------------------------------------------------------------------------------- Education Screening Details Patient Name: [name] Date of Service: 04/14/2020 1:00 PM Medical Record Number: [id number] Patient Account Number: [id number] Date of Birth/Sex: 1961-06-25 (58 y.o. M) Treating RN: Army Melia Primary Care Dareld Mcauliffe: PATIENT, NO Other Clinician: Referring Abeeha Twist: Thersa Salt Treating Everitt Wenner/Extender: Tito Dine in Treatment: 0 Primary Learner Assessed: Patient Learning Preferences/Education Level/Primary Language Learning Preference: Explanation Highest Education Level: College or Above Preferred Language: English Cognitive Barrier Language Barrier: No Translator Needed: No Memory Deficit: No Emotional Barrier: No Cultural/Religious Beliefs Affecting Medical Care: No Physical Barrier Impaired Vision: No Impaired Hearing: No Decreased Hand dexterity: No Knowledge/Comprehension Knowledge Level: High Comprehension Level: High Ability to understand written instructions: High Ability to understand verbal instructions: High Motivation Anxiety Level: Calm Cooperation: Cooperative Education Importance: Acknowledges Need Interest in Health Problems: Asks Questions Perception: Coherent Willingness to Engage in Self-Management High Activities: Readiness to Engage in Self-Management High Activities: Electronic Signature(s) Signed: 04/14/2020 2:17:21 PM By: Army Melia Entered By: Army Melia on 04/14/2020 13:33:03 [name], [name] ([id number]) -------------------------------------------------------------------------------- Fall Risk Assessment Details Patient Name: [name] Date of Service: 04/14/2020 1:00 PM Medical Record Number: [id number] Patient Account Number: [id number] Date of Birth/Sex: 04-Apr-1961 (58 y.o. M) Treating RN: Army Melia Primary Care  Jacari Kirsten: PATIENT, NO Other Clinician: Referring Jakeia Carreras: Thersa Salt Treating Skylarr Liz/Extender: Tito Dine in Treatment: 0 Fall Risk Assessment Items Have you had 2 or more falls in the last 12 monthso 0 No Have you had any fall that resulted in injury in the last 12 monthso 0 No FALLS RISK SCREEN History of falling - immediate or within 3 months 0 No Secondary diagnosis (Do you have 2 or more medical diagnoseso) 0 No Ambulatory aid None/bed rest/wheelchair/nurse 0 No Crutches/cane/walker 0 No Furniture 0 No Intravenous therapy Access/Saline/Heparin Lock 0 No Gait/Transferring Normal/ bed rest/ wheelchair 0 No Weak (short steps  with or without shuffle, stooped but able to lift head while walking, may 0 No seek support from furniture) Impaired (short steps with shuffle, may have difficulty arising from chair, head down, impaired 0 No balance) Mental Status Oriented to own ability 0 No Electronic Signature(s) Signed: 04/14/2020 2:17:21 PM By: Rodell Perna Entered By: Rodell Perna on 04/14/2020 13:33:08 [name], [name] ([id number]) -------------------------------------------------------------------------------- Foot Assessment Details Patient Name: [name] Date of Service: 04/14/2020 1:00 PM Medical Record Number: [id number] Patient Account Number: [id number] Date of Birth/Sex: [birth year] (58 y.o. M) Treating RN: Rodell Perna Primary Care Hanalei Glace: PATIENT, NO Other Clinician: Referring Sadae Arrazola: Everlene Other Treating Martha Soltys/Extender: Altamese [place] in Treatment: 0 Foot Assessment Items Site Locations + = Sensation present, - = Sensation absent, C = Callus, U = Ulcer R = Redness, W = Warmth, M = Maceration, PU = Pre-ulcerative lesion F = Fissure, S = Swelling, D = Dryness Assessment Right: Left: Other Deformity: No No Prior Foot Ulcer: No No Prior Amputation: No No Charcot Joint: No No Ambulatory Status: Ambulatory Without Help Gait:  Steady Electronic Signature(s) Signed: 04/14/2020 2:17:21 PM By: Rodell Perna Entered By: Rodell Perna on 04/14/2020 13:34:20 [name], [name] ([id number]) -------------------------------------------------------------------------------- Nutrition Risk Screening Details Patient Name: [name] Date of Service: 04/14/2020 1:00 PM Medical Record Number: [id number] Patient Account Number: [id number] Date of Birth/Sex: Aug 17, 1961 (58 y.o. M) Treating RN: Rodell Perna Primary Care Choya Tornow: PATIENT, NO Other Clinician: Referring Ellyssa Zagal: Everlene Other Treating Harlis Champoux/Extender: Altamese [place] in Treatment: 0 Height (in): 75 Weight (lbs): 241 Body Mass Index (BMI): 30.1 Nutrition Risk Screening Items Score Screening NUTRITION RISK SCREEN: I have an illness or condition that made me change the kind and/or amount of food I eat 0 No I eat fewer than two meals per day 0 No I eat few fruits and vegetables, or milk products 0 No I have three or more drinks of beer, liquor or wine almost every day 0 No I have tooth or mouth problems that make it hard for me to eat 0 No I don't always have enough money to buy the food I need 0 No I eat alone most of the time 0 No I take three or more different prescribed or over-the-counter drugs a day 0 No Without wanting to, I have lost or gained 10 pounds in the last six months 0 No I am not always physically able to shop, cook and/or feed myself 0 No Nutrition Protocols Good Risk Protocol 0 No interventions needed Moderate Risk Protocol High Risk Proctocol Risk Level: Good Risk Score: 0 Electronic Signature(s) Signed: 04/14/2020 2:17:21 PM By: Rodell Perna Entered By: Rodell Perna on 04/14/2020 13:33:19

## 2020-04-15 NOTE — Progress Notes (Signed)
[name], [name] ([id number]) Visit Report for 04/14/2020 Chief Complaint Document Details Patient Name: [name], [name] Date of Service: 04/14/2020 1:00 PM Medical Record Number: [id number] Patient Account Number: [id number] Date of Birth/Sex: 07/03/1961 (58 y.o. M) Treating RN: Huel Coventry Primary Care Provider: PATIENT, NO Other Clinician: Referring Provider: Everlene Other Treating Provider/Extender: Altamese [place] in Treatment: 0 Information Obtained from: Patient Chief Complaint 04/14/2020; patient is here for review of skin changes on his bilateral lower leg Electronic Signature(s) Signed: 04/14/2020 4:22:42 PM By: Baltazar Najjar MD Entered By: Baltazar Najjar on 04/14/2020 14:13:31 [name], [name] ([id number]) -------------------------------------------------------------------------------- HPI Details Patient Name: [name] Date of Service: 04/14/2020 1:00 PM Medical Record Number: [id number] Patient Account Number: [id number] Date of Birth/Sex: [birth year] (58 y.o. M) Treating RN: Huel Coventry Primary Care Provider: PATIENT, NO Other Clinician: Referring Provider: Everlene Other Treating Provider/Extender: Altamese [place] in Treatment: 0 History of Present Illness HPI Description: ADMISSION 04/14/2020 This is a 59 year old man who is a type II diabetic. He states that his problem began 2 months ago he was working in the yard got startled by a nest of yellow jackets and fell into a patch of thorn bushes. This punctured several areas of his bilateral legs anteriorly on the right and medially on the left. After short period of time these began to get purulent and painful. He would poke safety pins and these to drain the pus. He was seen by primary care and received a course of Augmentin which helped quite a bit. He was back in the ER on 03/09/2020 received a course of doxycycline and again this helped. In fact currently he has no open wounds although the  picture on 3/23 showed what looked to be bilateral abscesses especially one on the right medial upper calf was open. According the patient purulent drainage but unfortunately no cultures were done. The patient has no history of inflammatory arthritis, lupus rashes, staph infections or inflammatory bowel disease Past medical history includes coronary artery disease with an MI in 2016 systolic congestive heart failure, A. fib, hypertension, treated hepatitis C and diabetes. ABIs in our clinic were 1.41 on the right Electronic Signature(s) Signed: 04/14/2020 4:22:42 PM By: Baltazar Najjar MD Entered By: Baltazar Najjar on 04/14/2020 14:15:56 [name], [name] ([id number]) -------------------------------------------------------------------------------- Physical Exam Details Patient Name: [name] Date of Service: 04/14/2020 1:00 PM Medical Record Number: [id number] Patient Account Number: [id number] Date of Birth/Sex: [birth year] (58 y.o. M) Treating RN: Huel Coventry Primary Care Provider: PATIENT, NO Other Clinician: Referring Provider: Everlene Other Treating Provider/Extender: Altamese [place] in Treatment: 0 Constitutional Patient is hypertensive.. Pulse regular and within target range for patient.Marland Kitchen Respirations regular, non-labored and within target range.. Temperature is normal and within the target range for the patient.Marland Kitchen appears in no distress. Cardiovascular Pedal pulses palpable and strong bilaterally.. No signs of chronic venous into. Integumentary (Hair, Skin) No additional skin changes are seen outside of his lower extremities. Notes Wound exam; he has no open wounds however he does have what looks to be scarred areas predominantly on his right medial calf several raised areas but they are nontender there is not appear to be any underlying fluid and I was reluctant to attempt to obtain specimens for culture as this all could be residual scar tissue/healed. He has a  similar area on the left medial but not as much as the right side. There is no evidence of active cellulitis Electronic Signature(s) Signed: 04/14/2020 4:22:42 PM By: Baltazar Najjar  MD Entered By: Baltazar Najjar on 04/14/2020 14:17:33 [name], [name] ([id number]) -------------------------------------------------------------------------------- Physician Orders Details Patient Name: [name] Date of Service: 04/14/2020 1:00 PM Medical Record Number: [id number] Patient Account Number: [id number] Date of Birth/Sex: [birth year] (58 y.o. M) Treating RN: Huel Coventry Primary Care Provider: PATIENT, NO Other Clinician: Referring Provider: Everlene Other Treating Provider/Extender: Altamese [place] in Treatment: 0 Verbal / Phone Orders: No Diagnosis Coding Discharge From [facility] Services o Discharge from Wound Care Center - Consult Electronic Signature(s) Signed: 04/14/2020 4:22:42 PM By: Baltazar Najjar MD Signed: 04/14/2020 4:39:52 PM By: Elliot Gurney BSN, RN, CWS, Kim RN, BSN Entered By: Elliot Gurney, BSN, RN, CWS, Kim on 04/14/2020 13:58:24 [name], [name] ([id number]) -------------------------------------------------------------------------------- Problem List Details Patient Name: [name] Date of Service: 04/14/2020 1:00 PM Medical Record Number: [id number] Patient Account Number: [id number] Date of Birth/Sex: 12-18-1961 (58 y.o. M) Treating RN: Huel Coventry Primary Care Provider: PATIENT, NO Other Clinician: Referring Provider: Everlene Other Treating Provider/Extender: Altamese [place] in Treatment: 0 Active Problems ICD-10 Encounter Code Description Active Date MDM Diagnosis L02.415 Cutaneous abscess of right lower limb 04/14/2020 No Yes L02.416 Cutaneous abscess of left lower limb 04/14/2020 No Yes Inactive Problems Resolved Problems Electronic Signature(s) Signed: 04/14/2020 4:22:42 PM By: Baltazar Najjar MD Entered By: Baltazar Najjar on 04/14/2020  14:01:12 [name], [name] ([id number]) -------------------------------------------------------------------------------- Progress Note Details Patient Name: [name] Date of Service: 04/14/2020 1:00 PM Medical Record Number: [id number] Patient Account Number: [id number] Date of Birth/Sex: [birth year] (58 y.o. M) Treating RN: Huel Coventry Primary Care Provider: PATIENT, NO Other Clinician: Referring Provider: Everlene Other Treating Provider/Extender: Altamese [place] in Treatment: 0 Subjective Chief Complaint Information obtained from Patient 04/14/2020; patient is here for review of skin changes on his bilateral lower leg History of Present Illness (HPI) ADMISSION 04/14/2020 This is a 59 year old man who is a type II diabetic. He states that his problem began 2 months ago he was working in the yard got startled by a nest of yellow jackets and fell into a patch of thorn bushes. This punctured several areas of his bilateral legs anteriorly on the right and medially on the left. After short period of time these began to get purulent and painful. He would poke safety pins and these to drain the pus. He was seen by primary care and received a course of Augmentin which helped quite a bit. He was back in the ER on 03/09/2020 received a course of doxycycline and again this helped. In fact currently he has no open wounds although the picture on 3/23 showed what looked to be bilateral abscesses especially one on the right medial upper calf was open. According the patient purulent drainage but unfortunately no cultures were done. The patient has no history of inflammatory arthritis, lupus rashes, staph infections or inflammatory bowel disease Past medical history includes coronary artery disease with an MI in 2016 systolic congestive heart failure, A. fib, hypertension, treated hepatitis C and diabetes. ABIs in our clinic were 1.41 on the right Patient History Information obtained from  Patient. Allergies No Known Allergies Family History Cancer - Father,Siblings, Heart Disease - Siblings, Hypertension - Father,Siblings, Kidney Disease - Siblings, Lung Disease - Father, No family history of Diabetes, Hereditary Spherocytosis, Seizures, Stroke, Thyroid Problems, Tuberculosis. Social History Current every day smoker - 1/2, Marital Status - Separated, Alcohol Use - Never, Drug Use - No History, Caffeine Use - Moderate. Medical History Eyes Denies history of Cataracts, Glaucoma, Optic Neuritis Ear/Nose/Mouth/Throat Denies history  of Chronic sinus problems/congestion, Middle ear problems Hematologic/Lymphatic Denies history of Anemia, Hemophilia, Human Immunodeficiency Virus, Lymphedema, Sickle Cell Disease Respiratory Denies history of Aspiration, Asthma, Chronic Obstructive Pulmonary Disease (COPD), Pneumothorax, Sleep Apnea, Tuberculosis Cardiovascular Patient has history of Arrhythmia - a fib, Congestive Heart Failure, Hypertension, Myocardial Infarction Denies history of Angina, Coronary Artery Disease, Deep Vein Thrombosis, Hypotension, Peripheral Arterial Disease, Peripheral Venous Disease, Phlebitis, Vasculitis Gastrointestinal Patient has history of Hepatitis C Denies history of Cirrhosis , Colitis, Crohn s, Hepatitis A, Hepatitis B Endocrine Patient has history of Type II Diabetes Genitourinary Denies history of End Stage Renal Disease Immunological Denies history of Lupus Erythematosus, Raynaud s, Scleroderma Integumentary (Skin) Denies history of History of Burn, History of pressure wounds Musculoskeletal Denies history of Gout, Rheumatoid Arthritis, Osteoarthritis, Osteomyelitis Neurologic [name], [name] ([id number]) Denies history of Dementia, Neuropathy, Quadriplegia, Paraplegia, Seizure Disorder Oncologic Denies history of Received Chemotherapy, Received Radiation Psychiatric Denies history of Anorexia/bulimia, Confinement Anxiety Patient is  treated with Oral Agents. Blood sugar is not tested. Review of Systems (ROS) Constitutional Symptoms (General Health) Denies complaints or symptoms of Fatigue, Fever, Chills, Marked Weight Change. Eyes Denies complaints or symptoms of Dry Eyes, Vision Changes, Glasses / Contacts. Ear/Nose/Mouth/Throat Denies complaints or symptoms of Difficult clearing ears, Sinusitis. Hematologic/Lymphatic Denies complaints or symptoms of Bleeding / Clotting Disorders, Human Immunodeficiency Virus. Respiratory Denies complaints or symptoms of Chronic or frequent coughs, Shortness of Breath. Cardiovascular Denies complaints or symptoms of Chest pain, LE edema. Gastrointestinal Denies complaints or symptoms of Frequent diarrhea, Nausea, Vomiting. Endocrine Denies complaints or symptoms of Hepatitis, Thyroid disease, Polydypsia (Excessive Thirst). Genitourinary Denies complaints or symptoms of Kidney failure/ Dialysis, Incontinence/dribbling. Immunological Denies complaints or symptoms of Hives, Itching. Integumentary (Skin) Complains or has symptoms of Wounds. Denies complaints or symptoms of Bleeding or bruising tendency, Breakdown, Swelling. Musculoskeletal Denies complaints or symptoms of Muscle Pain, Muscle Weakness. Neurologic Denies complaints or symptoms of Numbness/parasthesias, Focal/Weakness. Psychiatric Denies complaints or symptoms of Anxiety, Claustrophobia. Objective Constitutional Patient is hypertensive.. Pulse regular and within target range for patient.Marland Kitchen Respirations regular, non-labored and within target range.. Temperature is normal and within the target range for the patient.Marland Kitchen appears in no distress. Vitals Time Taken: 1:20 PM, Height: 75 in, Source: Stated, Weight: 241 lbs, Source: Measured, BMI: 30.1, Temperature: 99.5 F, Pulse: 109 bpm, Respiratory Rate: 18 breaths/min, Blood Pressure: 139/90 mmHg. Cardiovascular Pedal pulses palpable and strong bilaterally.. No signs of  chronic venous into. General Notes: Wound exam; he has no open wounds however he does have what looks to be scarred areas predominantly on his right medial calf several raised areas but they are nontender there is not appear to be any underlying fluid and I was reluctant to attempt to obtain specimens for culture as this all could be residual scar tissue/healed. He has a similar area on the left medial but not as much as the right side. There is no evidence of active cellulitis Integumentary (Hair, Skin) No additional skin changes are seen outside of his lower extremities. Assessment Active Problems ICD-10 [name], [name]. ([id number]) Cutaneous abscess of right lower limb Cutaneous abscess of left lower limb Plan Discharge From [facility] Services: Discharge from Wound Care Center - Consult 1. The patient does not have any open wounds. 2. I suspect the areas on his right great and the left leg were abscesses at 1 point and this appears to have opened into wounds via the picture from the ER on 03/09/2020 that is available in epic. He received a course  of doxycycline at that point and everything closed off. He had also previously responded to Augmentin. This would lead me to believe that this was probably methicillin sensitive staph aureus. In puncturing these at home he could have been spreading this from place to place and I explained this to him. 3. If this is culture -1 would wonder about neutrophilic dermatosis although I think this would be considerably less likely than #2 4. I told him he could come back if something reopened here however I did not think there was anything I could actively culture at this point that would be relevant I spent 30 minutes in review of this patient's records, face-to-face evaluation and preparation of this record Electronic Signature(s) Signed: 04/14/2020 4:22:42 PM By: Baltazar Najjar MD Entered By: Baltazar Najjar on 04/14/2020 14:19:54 [name], [name]  ([id number]) -------------------------------------------------------------------------------- ROS/PFSH Details Patient Name: [name] Date of Service: 04/14/2020 1:00 PM Medical Record Number: [id number] Patient Account Number: [id number] Date of Birth/Sex: 1961-02-01 (58 y.o. M) Treating RN: Rodell Perna Primary Care Provider: PATIENT, NO Other Clinician: Referring Provider: Everlene Other Treating Provider/Extender: Altamese [place] in Treatment: 0 Information Obtained From Patient Constitutional Symptoms (General Health) Complaints and Symptoms: Negative for: Fatigue; Fever; Chills; Marked Weight Change Eyes Complaints and Symptoms: Negative for: Dry Eyes; Vision Changes; Glasses / Contacts Medical History: Negative for: Cataracts; Glaucoma; Optic Neuritis Ear/Nose/Mouth/Throat Complaints and Symptoms: Negative for: Difficult clearing ears; Sinusitis Medical History: Negative for: Chronic sinus problems/congestion; Middle ear problems Hematologic/Lymphatic Complaints and Symptoms: Negative for: Bleeding / Clotting Disorders; Human Immunodeficiency Virus Medical History: Negative for: Anemia; Hemophilia; Human Immunodeficiency Virus; Lymphedema; Sickle Cell Disease Respiratory Complaints and Symptoms: Negative for: Chronic or frequent coughs; Shortness of Breath Medical History: Negative for: Aspiration; Asthma; Chronic Obstructive Pulmonary Disease (COPD); Pneumothorax; Sleep Apnea; Tuberculosis Cardiovascular Complaints and Symptoms: Negative for: Chest pain; LE edema Medical History: Positive for: Arrhythmia - a fib; Congestive Heart Failure; Hypertension; Myocardial Infarction Negative for: Angina; Coronary Artery Disease; Deep Vein Thrombosis; Hypotension; Peripheral Arterial Disease; Peripheral Venous Disease; Phlebitis; Vasculitis Gastrointestinal Complaints and Symptoms: Negative for: Frequent diarrhea; Nausea; Vomiting Medical History: Positive  for: Hepatitis C Negative for: Cirrhosis ; Colitis; Crohnos; Hepatitis A; Hepatitis B Endocrine Hiltz, Zyire E. ([id number]) Complaints and Symptoms: Negative for: Hepatitis; Thyroid disease; Polydypsia (Excessive Thirst) Medical History: Positive for: Type II Diabetes Treated with: Oral agents Blood sugar tested every day: No Genitourinary Complaints and Symptoms: Negative for: Kidney failure/ Dialysis; Incontinence/dribbling Medical History: Negative for: End Stage Renal Disease Immunological Complaints and Symptoms: Negative for: Hives; Itching Medical History: Negative for: Lupus Erythematosus; Raynaudos; Scleroderma Integumentary (Skin) Complaints and Symptoms: Positive for: Wounds Negative for: Bleeding or bruising tendency; Breakdown; Swelling Medical History: Negative for: History of Burn; History of pressure wounds Musculoskeletal Complaints and Symptoms: Negative for: Muscle Pain; Muscle Weakness Medical History: Negative for: Gout; Rheumatoid Arthritis; Osteoarthritis; Osteomyelitis Neurologic Complaints and Symptoms: Negative for: Numbness/parasthesias; Focal/Weakness Medical History: Negative for: Dementia; Neuropathy; Quadriplegia; Paraplegia; Seizure Disorder Psychiatric Complaints and Symptoms: Negative for: Anxiety; Claustrophobia Medical History: Negative for: Anorexia/bulimia; Confinement Anxiety Oncologic Medical History: Negative for: Received Chemotherapy; Received Radiation Immunizations Pneumococcal Vaccine: Received Pneumococcal Vaccination: No Implantable Devices None Weltz, Byrant E. ([id number]) Family and Social History Cancer: Yes - Father,Siblings; Diabetes: No; Heart Disease: Yes - Siblings; Hereditary Spherocytosis: No; Hypertension: Yes - Father,Siblings; Kidney Disease: Yes - Siblings; Lung Disease: Yes - Father; Seizures: No; Stroke: No; Thyroid Problems: No; Tuberculosis: No; Current every day smoker - 1/2; Marital Status -  Separated;  Alcohol Use: Never; Drug Use: No History; Caffeine Use: Moderate; Financial Concerns: No; Food, Clothing or Shelter Needs: No; Support System Lacking: No; Transportation Concerns: No Electronic Signature(s) Signed: 04/14/2020 2:17:21 PM By: Army Melia Signed: 04/14/2020 4:22:42 PM By: Linton Ham MD Entered By: Army Melia on 04/14/2020 13:32:32 Shoff, Lew Dawes ([id number]) -------------------------------------------------------------------------------- SuperBill Details Patient Name: Illene Regulus Date of Service: 04/14/2020 Medical Record Number: [id number] Patient Account Number: [id number] Date of Birth/Sex: [birth year] (58 y.o. M) Treating RN: Cornell Barman Primary Care Provider: PATIENT, NO Other Clinician: Referring Provider: Thersa Salt Treating Provider/Extender: Tito Dine in Treatment: 0 Diagnosis Coding ICD-10 Codes Code Description L02.415 Cutaneous abscess of right lower limb L02.416 Cutaneous abscess of left lower limb Facility Procedures CPT4 Code: 95284132 Description: 44010 - WOUND CARE VISIT-LEV 3 EST PT Modifier: Quantity: 1 Physician Procedures CPT4 Code: [id number] Description: WC PHYS LEVEL 3 o NEW PT Modifier: Quantity: 1 CPT4 Code: Description: ICD-10 Diagnosis Description L02.415 Cutaneous abscess of right lower limb L02.416 Cutaneous abscess of left lower limb Modifier: Quantity: Electronic Signature(s) Signed: 04/14/2020 4:22:42 PM By: Linton Ham MD Entered By: Linton Ham on 04/14/2020 14:20:20

## 2020-04-15 NOTE — Progress Notes (Signed)
[name], [name] ([id number]) Visit Report for 04/14/2020 Allergy List Details Patient Name: [name], [name] Date of Service: 04/14/2020 1:00 PM Medical Record Number: [id number] Patient Account Number: [id number] Date of Birth/Sex: Mar 17, 1961 (58 y.o. M) Treating [name]: [name] Primary Care [name]: PATIENT, NO Other Clinician: Referring [name]: [name] Other Treating [name]: [name]: 0 Allergies Active Allergies No Known Allergies Allergy Notes Electronic Signature(s) Signed: 04/14/2020 2:17:21 PM By: [name] Entered By: [name] on 04/14/2020 13:28:01 Mangiaracina, [name] ([id number]) -------------------------------------------------------------------------------- Arrival Information Details Patient Name: [name] Date of Service: 04/14/2020 1:00 PM Medical Record Number: [id number] Patient Account Number: [id number] Date of Birth/Sex: 12-Aug-1961 (58 y.o. M) Treating [name]: [name] Primary Care Aspen Lawrance: PATIENT, NO Other Clinician: Referring [name]: [name] Other Treating [name]/Extender: [name] in [name]: 0 Visit Information Patient Arrived: Ambulatory Arrival Time: 13:21 Accompanied By: self Transfer Assistance: None Patient Identification Verified: Yes Secondary Verification Process Completed: Yes Electronic Signature(s) Signed: 04/14/2020 3:45:27 PM By: [name] RCP, RRT, CHT Entered By: [name] on 04/14/2020 13:21:25 [name], [name] ([id number]) -------------------------------------------------------------------------------- Clinic Level of Care Assessment Details Patient Name: [name] Date of Service: 04/14/2020 1:00 PM Medical Record Number: [id number] Patient Account Number: [id number] Date of Birth/Sex: 13-Feb-1961 (58 y.o. M) Treating [name]: [name] Primary Care [name]: PATIENT, NO Other Clinician: Referring [name]: [name] Other Treating [name]/Extender: Victor  in [name]: 0 Clinic Level of Care Assessment Items TOOL 2 Quantity Score []  - Use when only an EandM is performed on the INITIAL visit 0 ASSESSMENTS - Nursing Assessment / Reassessment X - General Physical Exam (combine w/ comprehensive assessment (listed just below) when performed on new 1 20 pt. evals) X- 1 25 Comprehensive Assessment (HX, ROS, Risk Assessments, Wounds Hx, etc.) ASSESSMENTS - Wound and Skin Assessment / Reassessment []  - Simple Wound Assessment / Reassessment - one wound 0 []  - 0 Complex Wound Assessment / Reassessment - multiple wounds X- 1 10 Dermatologic / Skin Assessment (not related to wound area) ASSESSMENTS - Ostomy and/or Continence Assessment and Care []  - Incontinence Assessment and Management 0 []  - 0 Ostomy Care Assessment and Management (repouching, etc.) PROCESS - Coordination of Care X - Simple Patient / Family Education for ongoing care 1 15 []  - 0 Complex (extensive) Patient / Family Education for ongoing care []  - 0 Staff obtains , Records, Test Results / Process Orders []  - 0 Staff telephones HHA, Nursing Homes / Clarify orders / etc []  - 0 Routine Transfer to another Facility (non-emergent condition) []  - 0 Routine Hospital Admission (non-emergent condition) []  - 0 New Admissions / / Ordering NPWT, Apligraf, etc. []  - 0 Emergency Hospital Admission (emergent condition) X- 1 10 Simple Discharge Coordination []  - 0 Complex (extensive) Discharge Coordination PROCESS - Special Needs []  - Pediatric / Minor Patient Management 0 []  - 0 Isolation Patient Management []  - 0 Hearing / Language / Visual special needs []  - 0 Assessment of Community assistance (transportation, D/C planning, etc.) []  - 0 Additional assistance / Altered mentation []  - 0 Support Surface(s) Assessment (bed, cushion, seat, etc.) INTERVENTIONS - Wound Cleansing /  Measurement []  - Wound Imaging (photographs - any number of wounds) 0 []  - 0 Wound Tracing (instead of photographs) []  - 0 Simple Wound Measurement - one wound []  - 0 Complex Wound Measurement - multiple wounds [name], [name] ( ) []  - 0 Simple Wound Cleansing - one wound []  -  0 Complex Wound Cleansing - multiple wounds INTERVENTIONS - Wound Dressings []  - Small Wound Dressing one or multiple wounds 0 []  - 0 Medium Wound Dressing one or multiple wounds []  - 0 Large Wound Dressing one or multiple wounds []  - 0 Application of Medications - injection INTERVENTIONS - Miscellaneous []  - External ear exam 0 []  - 0 Specimen Collection (cultures, biopsies, blood, body fluids, etc.) []  - 0 Specimen(s) / Culture(s) sent or taken to Lab for analysis []  - 0 Patient Transfer (multiple staff / Harrel Lemon Lift / Similar devices) []  - 0 Simple Staple / Suture removal (25 or less) []  - 0 Complex Staple / Suture removal (26 or more) []  - 0 Hypo / Hyperglycemic Management (close monitor of Blood Glucose) X- 1 15 Ankle / Brachial Index (ABI) - do not check if billed separately Has the patient been seen at the hospital within the last three years: Yes Total Score: 95 Level Of Care: New/Established - Level 3 Electronic Signature(s) Signed: 04/14/2020 4:39:52 PM By: [name], BSN, [name], CWS, [name], BSN Entered By: [name], BSN, [name], CWS, Victor on 04/14/2020 13:59:01 [name], [name] ([id number]) -------------------------------------------------------------------------------- Encounter Discharge Information Details Patient Name: [name] Date of Service: 04/14/2020 1:00 PM Medical Record Number: [id number] Patient Account Number: [id number] Date of Birth/Sex: 26-Jul-1961 (58 y.o. M) Treating [name]: [name] Primary Care [name]: PATIENT, NO Other Clinician: Referring [name]: [name] Treating Kaylin Schellenberg/Extender: [name] in [name]: 0 Encounter Discharge  Information Items Discharge Condition: Stable Ambulatory Status: Ambulatory Discharge Destination: Home Transportation: Private Auto Accompanied By: self Schedule Follow-up Appointment: Yes Clinical Summary of Care: Electronic Signature(s) Signed: 04/14/2020 4:39:52 PM By: [name], BSN, [name], CWS, [name], BSN Entered By: [name], BSN, [name], CWS, Victor on 04/14/2020 13:59:37 Welliver, [name] ([id number]) -------------------------------------------------------------------------------- Lower Extremity Assessment Details Patient Name: [name] Date of Service: 04/14/2020 1:00 PM Medical Record Number: [id number] Patient Account Number: [id number] Date of Birth/Sex: 1961/04/14 (58 y.o. M) Treating [name]: Army Melia Primary Care Trasean Delima: PATIENT, NO Other Clinician: Referring Collie Kittel: [name] Treating Janayla Marik/Extender: Ricard Dillon [name] in [name]: 0 Edema Assessment Assessed: [Left: No] [Right: No] Edema: [Left: No] [Right: No] Calf Left: Right: Point of Measurement: 38 cm From Medial Instep 39 cm 39 cm Ankle Left: Right: Point of Measurement: 11 cm From Medial Instep 25 cm 24 cm Vascular Assessment Pulses: Dorsalis Pedis Palpable: [Left:Yes] [Right:Yes] Blood Pressure: Brachial: [Left:139] [Right:139] Dorsalis Pedis: 170 [Left:Dorsalis Pedis: 200] Ankle: Posterior Tibial: 180 [Left:Posterior Tibial: 185 1.29] [Right:1.44] Electronic Signature(s) Signed: 04/14/2020 2:17:21 PM By: Army Melia Entered By: Army Melia on 04/14/2020 13:45:03 Lennox, [name] ([id number]) -------------------------------------------------------------------------------- Multi Wound Chart Details Patient Name: [name] Date of Service: 04/14/2020 1:00 PM Medical Record Number: [id number] Patient Account Number: [id number] Date of Birth/Sex: 04-Sep-1961 (58 y.o. M) Treating [name]: [name] Primary Care Jameon Deller: PATIENT, NO Other Clinician: Referring Synthia Fairbank: [name] Treating Laurine Kuyper/Extender: [name] in [name]: 0 Vital Signs Height(in): 75 Pulse(bpm): 109 Weight(lbs): 241 Blood Pressure(mmHg): 139/90 Body Mass Index(BMI): 30 Temperature(F): 99.5 Respiratory Rate(breaths/min): 18 Photos: [N/A:N/A] Wound Location: Right, Medial Lower Leg Left, Medial Lower Leg N/A Wounding Event: Other Lesion Other Lesion N/A Primary Etiology: Diabetic Wound/Ulcer of the Lower Diabetic Wound/Ulcer of the Lower N/A Extremity Extremity Comorbid History: Arrhythmia, Congestive Heart Arrhythmia, Congestive Heart N/A Failure, Hypertension, Myocardial Failure, Hypertension, Myocardial Infarction, Hepatitis C, Type II Infarction, Hepatitis C, Type II Diabetes Diabetes Date Acquired: 01/19/2020 01/19/2020 N/A [name] of [name]: 0 0  N/A Wound Status: Open Open N/A Measurements L x W x D (cm) 0.2x0.2x0.1 0.2x0.2x0.1 N/A Area (cm) : 0.031 0.031 N/A Volume (cm) : 0.003 0.003 N/A Classification: Grade 2 Grade 2 N/A Exudate Amount: Medium Medium N/A Exudate Type: Serosanguineous Serosanguineous N/A Exudate Color: red, brown red, brown N/A Wound Margin: Flat and Intact N/A N/A Granulation Amount: Small (1-33%) Small (1-33%) N/A Granulation Quality: Pink Red N/A Necrotic Amount: Large (67-100%) Large (67-100%) N/A Necrotic Tissue: Eschar, Adherent Slough Eschar, Adherent Slough N/A Exposed Structures: Fat Layer (Subcutaneous Tissue) Fat Layer (Subcutaneous Tissue) N/A Exposed: Yes Exposed: Yes Fascia: No Fascia: No Tendon: No Tendon: No Muscle: No Muscle: No Joint: No Joint: No Bone: No Bone: No Epithelialization: None None N/A [name] Notes Electronic Signature(s) Signed: 04/14/2020 4:39:52 PM By: Elliot Gurney, BSN, [name], CWS, [name], BSN Entered By: Elliot Gurney, BSN, [name], CWS, Victor on 04/14/2020 13:53:30 Vantuyl, [name] ([id number]) -------------------------------------------------------------------------------- Multi-Disciplinary Care Plan  Details Patient Name: [name] Date of Service: 04/14/2020 1:00 PM Medical Record Number: [id number] Patient Account Number: [id number] Date of Birth/Sex: [birth year] (58 y.o. M) Treating [name]: [name] Primary Care Sena Hoopingarner: PATIENT, NO Other Clinician: Referring Lerin Jech: [name] Other Treating Mela Perham/Extender: Victor [place] in [name]: 0 Active Inactive Electronic Signature(s) Signed: 04/14/2020 4:39:52 PM By: Elliot Gurney, BSN, [name], CWS, [name], BSN Entered By: Elliot Gurney, BSN, [name], CWS, Victor on 04/14/2020 13:58:01 Blinn, [name] ([id number]) -------------------------------------------------------------------------------- Non-Wound Condition Assessment Details Patient Name: [name] Date of Service: 04/14/2020 1:00 PM Medical Record Number: [id number] Patient Account Number: [id number] Date of Birth/Sex: Jun 27, 1961 (58 y.o. M) Treating [name]: [name] Primary Care Aking Klabunde: PATIENT, NO Other Clinician: Referring Lashawnta Burgert: [name] Other Treating Javiel Canepa/Extender: Victor [place] in [name]: 0 Non-Wound Condition: Condition: Other Dermatologic Condition Location: Leg Side: Bilateral Notes Healed abscesses Electronic Signature(s) Signed: 04/14/2020 4:39:52 PM By: Elliot Gurney, BSN, [name], CWS, [name], BSN Entered By: Elliot Gurney, BSN, [name], CWS, Victor on 04/14/2020 13:57:05 Pfannenstiel, [name] ([id number]) -------------------------------------------------------------------------------- Pain Assessment Details Patient Name: [name] Date of Service: 04/14/2020 1:00 PM Medical Record Number: [id number] Patient Account Number: [id number] Date of Birth/Sex: 11-01-1961 (58 y.o. M) Treating [name]: [name] Primary Care Maycol Hoying: PATIENT, NO Other Clinician: Referring Syla Devoss: [name] Other Treating Patricia Perales/Extender: Victor [place] in [name]: 0 Active Problems Location of Pain Severity and Description of Pain Patient Has Paino No Site Locations Pain  Management and Medication Current Pain Management: Electronic Signature(s) Signed: 04/14/2020 3:45:27 PM By: [name] RCP, RRT, CHT Signed: 04/14/2020 4:39:52 PM By: Elliot Gurney, BSN, [name], CWS, [name], BSN Entered By: [name] on 04/14/2020 13:21:39 PRABHJOT, PISCITELLO ([id number]) -------------------------------------------------------------------------------- Patient/Caregiver Education Details Patient Name: [name] Date of Service: 04/14/2020 1:00 PM Medical Record Number: [id number] Patient Account Number: [id number] Date of Birth/Gender: Jan 06, 1961 (58 y.o. M) Treating [name]: [name] Primary Care Physician: PATIENT, NO Other Clinician: Referring Physician: Everlene Other Treating Physician/Extender: Victor [place] in [name]: 0 Education Assessment Education Provided To: Patient Education Topics Provided Wound/Skin Impairment: Handouts: Caring for Your Ulcer Methods: Demonstration, Explain/Verbal Responses: State content correctly Electronic Signature(s) Signed: 04/14/2020 4:39:52 PM By: Elliot Gurney, BSN, [name], CWS, [name], BSN Entered By: Elliot Gurney, BSN, [name], CWS, Victor on 04/14/2020 13:59:18 Manfred, [name] ([id number]) -------------------------------------------------------------------------------- Vitals Details Patient Name: [name] Date of Service: 04/14/2020 1:00 PM Medical Record Number: [id number] Patient Account Number: [id number] Date of Birth/Sex: 1961-02-22 (58 y.o. M) Treating [name]: [name] Primary Care Kaliann Coryell: PATIENT, NO Other Clinician: Referring Zorana Brockwell: Adriana Simas,  JAYCE Treating Lamon Rotundo/Extender: [name]: 0 Vital Signs Time Taken: 13:20 Temperature (F): 99.5 Height (in): 75 Pulse (bpm): 109 Source: Stated Respiratory Rate (breaths/min): 18 Weight (lbs): 241 Blood Pressure (mmHg): 139/90 Source: Measured Reference Range: 80 - 120 mg / dl Body Mass Index (BMI):  30.1 Electronic Signature(s) Signed: 04/14/2020 3:45:27 PM By: [name] RCP, RRT, CHT Entered By: [name] on 04/14/2020 13:24:09

## 2021-05-11 ENCOUNTER — Other Ambulatory Visit: Payer: Self-pay

## 2021-05-11 ENCOUNTER — Ambulatory Visit (INDEPENDENT_AMBULATORY_CARE_PROVIDER_SITE_OTHER): Payer: BC Managed Care – PPO | Admitting: Cardiology

## 2021-05-11 ENCOUNTER — Telehealth: Payer: Self-pay | Admitting: Pharmacist

## 2021-05-11 ENCOUNTER — Encounter: Payer: Self-pay | Admitting: Cardiology

## 2021-05-11 VITALS — BP 118/68 | HR 105 | Ht 75.0 in | Wt 239.0 lb

## 2021-05-11 DIAGNOSIS — I4819 Other persistent atrial fibrillation: Secondary | ICD-10-CM | POA: Diagnosis not present

## 2021-05-11 DIAGNOSIS — B182 Chronic viral hepatitis C: Secondary | ICD-10-CM

## 2021-05-11 DIAGNOSIS — I5022 Chronic systolic (congestive) heart failure: Secondary | ICD-10-CM

## 2021-05-11 DIAGNOSIS — E11621 Type 2 diabetes mellitus with foot ulcer: Secondary | ICD-10-CM

## 2021-05-11 DIAGNOSIS — L97509 Non-pressure chronic ulcer of other part of unspecified foot with unspecified severity: Secondary | ICD-10-CM

## 2021-05-11 NOTE — Patient Instructions (Addendum)
Medication Instructions:  Your physician recommends that you continue on your current medications as directed. Please refer to the Current Medication list given to you today. *If you need a refill on your cardiac medications before your next appointment, please call your pharmacy*  Lab Work: None ordered. If you have labs (blood work) drawn today and your tests are completely normal, you will receive your results only by: Marland Kitchen MyChart Message (if you have MyChart) OR . A paper copy in the mail If you have any lab test that is abnormal or we need to change your treatment, we will call you to review the results.  Testing/Procedures: None ordered.  Follow-Up: At [facility], you and your health needs are our priority.  As part of our continuing mission to provide you with exceptional heart care, we have created designated Provider Care Teams.  These Care Teams include your primary Cardiologist (physician) and Advanced Practice Providers (APPs -  Physician Assistants and Nurse Practitioners) who all work together to provide you with the care you need, when you need it.  Your next appointment:   Will be with the afib clinic to admit you for dofetilide.  They will contact you to schedule this appointment   AFIB CLINIC INFORMATION: Your appointment is scheduled on: _______________ at ________________. Please arrive 15 minutes early for check-in. The AFib Clinic is located in the [facility] at [facility]. Parking instructions/directions: [address] social research [address] ([address]). When you pull in to Entrance C, there is an underground parking garage to your right. The code to enter the garage is ______________________. Take the elevators to the first floor. Follow the signs to the [facility] Clin[facility]. You will see registration at the end of the hallway.  Phone number: [phone]   Dofetilide capsules What is this medicine? DOFETILIDE (doe FET il ide)  is an antiarrhythmic drug. It helps make your heart beat regularly. This medicine also helps to slow rapid heartbeats. This medicine may be used for other purposes; ask your health care provider or pharmacist if you have questions. COMMON BRAND NAME(S): Tikosyn What should I tell my health care provider before I take this medicine? They need to know if you have any of these conditions:  heart disease  history of irregular heartbeat  history of low levels of potassium or magnesium in the blood  kidney disease  liver disease  an unusual or allergic reaction to dofetilide, other medicines, foods, dyes, or preservatives  pregnant or trying to get pregnant  breast-feeding How should I use this medicine? Take this medicine by mouth with a glass of water. Follow the directions on the prescription label. Do not take with grapefruit juice. You can take it with or without food. If it upsets your stomach, take it with food. Take your medicine at regular intervals. Do not take it more often than directed. Do not stop taking except on your doctor's advice. A special MedGuide will be given to you by the pharmacist with each prescription and refill. Be sure to read this information carefully each time. Talk to your pediatrician regarding the use of this medicine in children. Special care may be needed. Overdosage: If you think you have taken too much of this medicine contact a poison control center or emergency room at once. NOTE: This medicine is only for you. Do not share this medicine with others. What if I miss a dose? If you miss a dose, skip it. Take your next  dose at the normal time. Do not take extra or 2 doses at the same time to make up for the missed dose. What may interact with this medicine? Do not take this medicine with any of the following  medications:  cimetidine  cisapride  dolutegravir  dronedarone  erdafitinib  hydrochlorothiazide  ketoconazole  megestrol  pimozide  prochlorperazine  thioridazine  trimethoprim  verapamil This medicine may also interact with the following medications:  amiloride  cannabinoids  certain antibiotics like erythromycin or clarithromycin  certain antiviral medicines for HIV or hepatitis  certain medicines for depression, anxiety, or psychotic disorders  digoxin  diltiazem  grapefruit juice  metformin  nefazodone  other medicines that prolong the QT interval (an abnormal heart rhythm)  quinine  triamterene  zafirlukast  ziprasidone This list may not describe all possible interactions. Give your health care provider a list of all the medicines, herbs, non-prescription drugs, or dietary supplements you use. Also tell them if you smoke, drink alcohol, or use illegal drugs. Some items may interact with your medicine. What should I watch for while using this medicine? Your condition will be monitored carefully while you are receiving this medicine. What side effects may I notice from receiving this medicine? Side effects that you should report to your doctor or health care professional as soon as possible:  allergic reactions like skin rash, itching or hives, swelling of the face, lips, or tongue  breathing problems  chest pain or chest tightness  dizziness  signs and symptoms of a dangerous change in heartbeat or heart rhythm like chest pain; dizziness; fast or irregular heartbeat; palpitations; feeling faint or lightheaded, falls; breathing problems  signs and symptoms of electrolyte imbalance like severe diarrhea, unusual sweating, vomiting, loss of appetite, increased thirst  swelling of the ankles, legs, or feet  tingling, numbness in the hands or feet Side effects that usually do not require medical attention (report to your doctor or health  care professional if they continue or are bothersome):  diarrhea  general ill feeling or flu-like symptoms  headache  nausea  trouble sleeping  stomach pain This list may not describe all possible side effects. Call your doctor for medical advice about side effects. You may report side effects to FDA at 1-800-FDA-1088. Where should I keep my medicine? Keep out of the reach of children. Store at room temperature between 15 and 30 degrees C (59 and 86 degrees F). Throw away any unused medicine after the expiration date. NOTE: This sheet is a summary. It may not cover all possible information. If you have questions about this medicine, talk to your doctor, pharmacist, or health care provider.  2021 Elsevier/Gold Standard (2019-11-04 12:14:05)

## 2021-05-11 NOTE — Telephone Encounter (Signed)
Medication list reviewed in anticipation of upcoming Tikosyn initiation. Patient is not taking any contraindicated or QTc prolonging medications.   Patient is anticoagulated on Xarelto 20mg  on the appropriate dose - need to clarify that pt is taking this once daily with his largest meal of the day (current med list just says take by mouth under pt sig). Please ensure that patient has not missed any anticoagulation doses in the 3 weeks prior to Tikosyn initiation.   Patient will need to be counseled to avoid use of Benadryl while on Tikosyn and in the 2-3 days prior to Tikosyn initiation.

## 2021-05-11 NOTE — Progress Notes (Signed)
Electrophysiology Office Note:    Date:  05/11/2021   ID:  HARLES EVETTS, DOB 1961/01/07, MRN [id number]  Referring MD: Marcina Millard, MD   Chief Complaint: Atrial fibrillation  History of Present Illness:    [name] is a 60 y.o. male who presents for an evaluation of atrial fibrillation at the request of Dr. Darrold Junker. Their medical history includes coronary artery disease (PCI to the RCA in 2016), diabetes, hepatitis C (reportedly cleared his infection with 28 weeks of treatment), hypertension, obesity, atrial fibrillation.  He last saw Dr. Darrold Junker April 11, 2021.  His diagnosis of atrial fibrillation dates back to 2019.  This is also around the time that he was diagnosed with systolic heart failure with an ejection fraction of 20 to 25%.  At the time of his April 25 appointment with Dr. Darrold Junker, he had been lost to follow-up since May 2020.  The patient had gone to [facility] on March 14, 2021 with A. fib with RVR.  He was restarted on all of his medications including Toprol-XL 100 mg daily, Xarelto and torsemide 20 mg daily.  The patient was previously seen by Dr. Graciela Husbands and placed on Tikosyn for his atrial fibrillation.  The patient self discontinued his Tikosyn for unclear reasons.  The patient also is managing a nonhealing wound on his lower extremity.  The wound started after he fell off a lawnmower.  He was treated with clindamycin and doxycycline.  He has diabetes with a hemoglobin A1c greater than 13.     Past Medical History:  Diagnosis Date  . Atrial fibrillation (HCC)   . Atrial fibrillation (HCC)   . CHF (congestive heart failure) (HCC)   . Coronary artery disease 2016   1 stent  . Diabetes mellitus without complication (HCC)   . ED (erectile dysfunction)   . Hepatitis C   . Hypertension   . Obesity     Past Surgical History:  Procedure Laterality Date  . CARDIOVERSION N/A 01/08/2019   Procedure: CARDIOVERSION (CATH LAB);  Surgeon:  Marcina Millard, MD;  Location: [facility] ORS;  Service: Cardiovascular;  Laterality: N/A;  . CORONARY STENT PLACEMENT  2016    Current Medications: Current Meds  Medication Sig  . aspirin 81 MG EC tablet Take by mouth.  . diltiazem (CARDIZEM CD) 180 MG 24 hr capsule Take 180 mg by mouth daily.  . Dulaglutide 1.5 MG/0.5ML SOPN Inject into the skin.  Marland Kitchen glipiZIDE (GLUCOTROL XL) 10 MG 24 hr tablet Take 1 tablet by mouth daily.  . Menthol-Zinc Oxide (GOLD BOND EX) Apply 1 application topically daily as needed (dry skin).  . metoprolol succinate (TOPROL-XL) 100 MG 24 hr tablet Take 100 mg by mouth daily. Take with or immediately following a meal.  . rivaroxaban (XARELTO) 20 MG TABS tablet Take by mouth.  . torsemide (DEMADEX) 20 MG tablet Take 40 mg by mouth daily.     Allergies:   Pseudoephedrine   Social History   Socioeconomic History  . Marital status: Married    Spouse name: Not on file  . Number of children: Not on file  . Years of education: Not on file  . Highest education level: Not on file  Occupational History  . Not on file  Tobacco Use  . Smoking status: Current Every Day Smoker    Packs/day: 0.50    Types: Cigarettes  . Smokeless tobacco: Never Used  Vaping Use  . Vaping Use: Never used  Substance and Sexual Activity  .  Alcohol use: No  . Drug use: Not Currently    Types: Marijuana    Comment: as a teen  . Sexual activity: Not Currently  Other Topics Concern  . Not on file  Social History Narrative  . Not on file   Social Determinants of Health   Financial Resource Strain: Not on file  Food Insecurity: Not on file  Transportation Needs: Not on file  Physical Activity: Not on file  Stress: Not on file  Social Connections: Not on file     Family History: The patient's family history includes Alzheimer's disease in his father; COPD in his father; Heart disease in his father; Hyperlipidemia in his father; Hypertension in his father; Other (age of onset:  84) in his mother; Thyroid disease in his mother.  ROS:   Please see the history of present illness.    All other systems reviewed and are negative.  EKGs/Labs/Other Studies Reviewed:    The following studies were reviewed today:   December 09, 2018 echo Left ventricular function severely reduced, 20% Moderate MR Dilated right ventricle Mildly dilated right atrium    03/14/2021 echo 1. Technically difficult study..  2. Echo contrast utilized to enhance endocardial border definition.  3. The left ventricle is mildly dilated in size with normal wall thickness.  4. The left ventricular systolic function is severely decreased, LVEF is  visually estimated at 25-30%.  5. The right ventricle is not well visualized but probably normal in size,  with low normal systolic function.  6. There is mild mitral valve regurgitation.  7. There is mild tricuspid regurgitation.  8. The left atrium is moderately dilated in size.  9. Estimated pulmonary artery systolic pressure is + RA pressure (IVC  not well visualized)..      March 2022 [facility] lab work Creatinine 0.60      EKG:  The ekg ordered today demonstrates atrial fibrillation with a ventricular rate of 105 bpm.  QTc is approximately 480 ms while in rapid A. fib.  Recent Labs: No results found for requested labs within last 8760 hours.  Recent Lipid Panel    Component Value Date/Time   CHOL 247 (H) 05/12/2010 2316   TRIG 152 (H) 05/12/2010 2316   HDL 39 (L) 05/12/2010 2316   CHOLHDL 6.3 Ratio 05/12/2010 2316   VLDL 30 05/12/2010 2316   LDLCALC 178 (H) 05/12/2010 2316    Physical Exam:    VS:  BP 118/68 (BP Location: Left Arm, Patient Position: Sitting, Cuff Size: Normal)   Pulse (!) 105   Ht 6\' 3"  (1.905 m)   Wt 239 lb (108.4 kg)   SpO2 96%   BMI 29.87 kg/m     Wt Readings from Last 3 Encounters:  05/11/21 239 lb (108.4 kg)  03/09/20 240 lb (108.9 kg)  02/05/19 224 lb 3.2 oz (101.7 kg)      GEN: in no acute distress.  Well-nourished HEENT: Normal NECK: No JVD; No carotid bruits LYMPHATICS: No lymphadenopathy CARDIAC: Irregularly irregular, tachycardic, no murmurs, rubs, gallops RESPIRATORY:  Clear to auscultation without rales, wheezing or rhonchi  ABDOMEN: Soft, non-tender, non-distended MUSCULOSKELETAL:  No edema; No deformity  SKIN: Warm and dry.  Wounds on bilateral lower extremities are wrapped in compression bandages.  According to patient, there are quarter sized lesions on bilateral lower extremities. NEUROLOGIC:  Alert and oriented x 3 PSYCHIATRIC:  Normal affect   ASSESSMENT:    1. Chronic systolic heart failure (HCC)   2. Persistent atrial fibrillation (  HCC)   3. Type 2 diabetes mellitus with foot ulcer, without long-term current use of insulin (HCC)   4. Chronic hepatitis C without hepatic coma (HCC)    PLAN:    In order of problems listed above:  1. Chronic combined systolic and diastolic heart failure NYHA class II.  Warm and dry.  EF 25 to 30%. I suspect there is an element of tachycardia mediated cardiomyopathy contributing to his left ventricular dysfunction.  He should continue taking his lisinopril, metoprolol, torsemide.  I do think a rhythm control strategy is indicated given his left ventricular dysfunction.  2.  Persistent atrial fibrillation Given his coronary artery disease and left ventricular dysfunction class Ic agents are contraindicated.  Sotalol is not an option because of his left ventricular dysfunction and heart failure hospitalizations.  Multaq is not an option because of his heart failure hospitalizations.  Given his young age, I do not think amiodarone is a good option.  I discussed restarting his Tikosyn and he is open to this.  I would like him to get his diabetes better controlled and have his wounds treated for a longer period of time before starting the Tikosyn.  I would target admitting him to the hospital in late June to  have his Tikosyn started and for possible cardioversion.  He is amenable to this plan.  He should continue taking his Eliquis 5 mg twice daily for stroke prophylaxis.  Ideally, he would have an ablation to help manage his atrial arrhythmias but he is not currently a candidate for multiple reasons including lower extremity wounds, uncontrolled diabetes and nonadherence to his medical regimen.  3.  Hepatitis C Treated.  4.  Diabetes Not controlled with a hemoglobin A1c greater than 10.  This needs to come down ASAP.  He has follow-up in June with [facility] primary care regarding his diabetes medications.   Total time spent with patient today 65 minutes. This includes reviewing records, evaluating the patient and coordinating care.  Medication Adjustments/Labs and Tests Ordered: Current medicines are reviewed at length with the patient today.  Concerns regarding medicines are outlined above.  Orders Placed This Encounter  Procedures  . EKG 12-Lead   No orders of the defined types were placed in this encounter.    Signed, Rossie Muskrat. Lalla Brothers, MD, [facility], [facility] 05/11/2021 3:23 PM    Electrophysiology [facility]

## 2021-05-20 ENCOUNTER — Telehealth (HOSPITAL_COMMUNITY): Payer: Self-pay

## 2021-05-20 NOTE — Telephone Encounter (Signed)
I spoke with [name] from [facility] and his preadmission stay for Tikosyn date of service on 06/06/21 does not require precert.

## 2021-06-03 ENCOUNTER — Other Ambulatory Visit (HOSPITAL_COMMUNITY)
Admission: RE | Admit: 2021-06-03 | Discharge: 2021-06-03 | Disposition: A | Discharge status: Home / Self Care | Payer: BC Managed Care – PPO | Source: Ambulatory Visit | Attending: Cardiology | Admitting: Cardiology

## 2021-06-03 ENCOUNTER — Inpatient Hospital Stay: Admission: RE | Admit: 2021-06-03 | Payer: BC Managed Care – PPO | Source: Ambulatory Visit

## 2021-06-03 DIAGNOSIS — Z01812 Encounter for preprocedural laboratory examination: Secondary | ICD-10-CM | POA: Insufficient documentation

## 2021-06-03 DIAGNOSIS — Z20822 Contact with and (suspected) exposure to covid-19: Secondary | ICD-10-CM | POA: Insufficient documentation

## 2021-06-03 LAB — SARS CORONAVIRUS 2 (TAT 6-24 HRS): SARS Coronavirus 2: NEGATIVE

## 2021-06-06 ENCOUNTER — Encounter (HOSPITAL_COMMUNITY): Payer: Self-pay | Admitting: Cardiology

## 2021-06-06 ENCOUNTER — Inpatient Hospital Stay (HOSPITAL_COMMUNITY)
Admission: RE | Admit: 2021-06-06 | Discharge: 2021-06-09 | DRG: 309 | Disposition: A | Discharge status: Home / Self Care | Payer: BC Managed Care – PPO | Source: Ambulatory Visit | Attending: Cardiology | Admitting: Cardiology

## 2021-06-06 ENCOUNTER — Ambulatory Visit (HOSPITAL_COMMUNITY)
Admission: RE | Admit: 2021-06-06 | Discharge: 2021-06-06 | Disposition: A | Discharge status: Home / Self Care | Payer: BC Managed Care – PPO | Source: Ambulatory Visit | Attending: Physician Assistant | Admitting: Physician Assistant

## 2021-06-06 ENCOUNTER — Other Ambulatory Visit: Payer: Self-pay

## 2021-06-06 ENCOUNTER — Other Ambulatory Visit (HOSPITAL_COMMUNITY): Payer: Self-pay

## 2021-06-06 VITALS — BP 146/78 | HR 118 | Ht 75.0 in | Wt 249.4 lb

## 2021-06-06 DIAGNOSIS — S91302A Unspecified open wound, left foot, initial encounter: Secondary | ICD-10-CM | POA: Diagnosis present

## 2021-06-06 DIAGNOSIS — Z7982 Long term (current) use of aspirin: Secondary | ICD-10-CM | POA: Diagnosis not present

## 2021-06-06 DIAGNOSIS — Z888 Allergy status to other drugs, medicaments and biological substances status: Secondary | ICD-10-CM

## 2021-06-06 DIAGNOSIS — Z7951 Long term (current) use of inhaled steroids: Secondary | ICD-10-CM

## 2021-06-06 DIAGNOSIS — Z6831 Body mass index (BMI) 31.0-31.9, adult: Secondary | ICD-10-CM | POA: Diagnosis not present

## 2021-06-06 DIAGNOSIS — Z9114 Patient's other noncompliance with medication regimen: Secondary | ICD-10-CM | POA: Diagnosis not present

## 2021-06-06 DIAGNOSIS — D6869 Other thrombophilia: Secondary | ICD-10-CM | POA: Diagnosis present

## 2021-06-06 DIAGNOSIS — I4819 Other persistent atrial fibrillation: Principal | ICD-10-CM | POA: Diagnosis present

## 2021-06-06 DIAGNOSIS — Z713 Dietary counseling and surveillance: Secondary | ICD-10-CM | POA: Diagnosis not present

## 2021-06-06 DIAGNOSIS — Z20822 Contact with and (suspected) exposure to covid-19: Secondary | ICD-10-CM | POA: Diagnosis present

## 2021-06-06 DIAGNOSIS — Z8249 Family history of ischemic heart disease and other diseases of the circulatory system: Secondary | ICD-10-CM | POA: Diagnosis not present

## 2021-06-06 DIAGNOSIS — E669 Obesity, unspecified: Secondary | ICD-10-CM | POA: Diagnosis present

## 2021-06-06 DIAGNOSIS — Z955 Presence of coronary angioplasty implant and graft: Secondary | ICD-10-CM

## 2021-06-06 DIAGNOSIS — I110 Hypertensive heart disease with heart failure: Secondary | ICD-10-CM | POA: Diagnosis present

## 2021-06-06 DIAGNOSIS — S91301A Unspecified open wound, right foot, initial encounter: Secondary | ICD-10-CM | POA: Diagnosis present

## 2021-06-06 DIAGNOSIS — E119 Type 2 diabetes mellitus without complications: Secondary | ICD-10-CM | POA: Diagnosis present

## 2021-06-06 DIAGNOSIS — X58XXXA Exposure to other specified factors, initial encounter: Secondary | ICD-10-CM | POA: Diagnosis present

## 2021-06-06 DIAGNOSIS — I428 Other cardiomyopathies: Secondary | ICD-10-CM | POA: Diagnosis not present

## 2021-06-06 DIAGNOSIS — Z7984 Long term (current) use of oral hypoglycemic drugs: Secondary | ICD-10-CM | POA: Diagnosis not present

## 2021-06-06 DIAGNOSIS — Z79899 Other long term (current) drug therapy: Secondary | ICD-10-CM | POA: Diagnosis not present

## 2021-06-06 DIAGNOSIS — F17210 Nicotine dependence, cigarettes, uncomplicated: Secondary | ICD-10-CM | POA: Diagnosis present

## 2021-06-06 DIAGNOSIS — I2510 Atherosclerotic heart disease of native coronary artery without angina pectoris: Secondary | ICD-10-CM | POA: Diagnosis present

## 2021-06-06 DIAGNOSIS — I5042 Chronic combined systolic (congestive) and diastolic (congestive) heart failure: Secondary | ICD-10-CM | POA: Diagnosis present

## 2021-06-06 DIAGNOSIS — Z7901 Long term (current) use of anticoagulants: Secondary | ICD-10-CM

## 2021-06-06 LAB — BASIC METABOLIC PANEL
Anion gap: 11 (ref 5–15)
BUN: 8 mg/dL (ref 6–20)
CO2: 28 mmol/L (ref 22–32)
Calcium: 8.5 mg/dL — ABNORMAL LOW (ref 8.9–10.3)
Chloride: 99 mmol/L (ref 98–111)
Creatinine, Ser: 0.78 mg/dL (ref 0.61–1.24)
GFR, Estimated: >60 mL/min (ref >60)
Glucose, Bld: 177 mg/dL — ABNORMAL HIGH (ref 70–99)
Potassium: 3.8 mmol/L (ref 3.5–5.1)
Sodium: 138 mmol/L (ref 135–145)

## 2021-06-06 LAB — MAGNESIUM: Magnesium: 2 mg/dL (ref 1.7–2.4)

## 2021-06-06 LAB — HIV ANTIBODY (ROUTINE TESTING W REFLEX): HIV Screen 4th Generation wRfx: NONREACTIVE

## 2021-06-06 MED ORDER — MAGNESIUM SULFATE 2 GM/50ML IV SOLN
2.0000 g | Freq: Once | INTRAVENOUS | Status: AC
  Administered 2021-06-06: 2 g via INTRAVENOUS
  Filled 2021-06-06: qty 50

## 2021-06-06 MED ORDER — SODIUM CHLORIDE 0.9 % IV SOLN: 250.0000 mL | INTRAVENOUS | Status: DC | PRN

## 2021-06-06 MED ORDER — SODIUM CHLORIDE 0.9% FLUSH
3.0000 mL | Freq: Two times a day (BID) | INTRAVENOUS | Status: DC
  Administered 2021-06-06 – 2021-06-09 (×7): 3 mL via INTRAVENOUS

## 2021-06-06 MED ORDER — ASPIRIN EC 81 MG PO TBEC
81.0000 mg | DELAYED_RELEASE_TABLET | Freq: Every day | ORAL | Status: DC
  Administered 2021-06-07 – 2021-06-09 (×3): 81 mg via ORAL
  Filled 2021-06-06 (×3): qty 1

## 2021-06-06 MED ORDER — METOPROLOL SUCCINATE ER 100 MG PO TB24
100.0000 mg | ORAL_TABLET | Freq: Every day | ORAL | Status: DC
  Administered 2021-06-07 – 2021-06-09 (×3): 100 mg via ORAL
  Filled 2021-06-06 (×3): qty 1

## 2021-06-06 MED ORDER — MAGNESIUM SULFATE 4 GM/100ML IV SOLN
4.0000 g | Freq: Once | INTRAVENOUS | Status: AC
  Administered 2021-06-07: 4 g via INTRAVENOUS
  Filled 2021-06-06: qty 100

## 2021-06-06 MED ORDER — TORSEMIDE 20 MG PO TABS
40.0000 mg | ORAL_TABLET | Freq: Every day | ORAL | Status: DC
  Administered 2021-06-07 – 2021-06-09 (×3): 40 mg via ORAL
  Filled 2021-06-06 (×3): qty 2

## 2021-06-06 MED ORDER — POTASSIUM CHLORIDE CRYS ER 20 MEQ PO TBCR
40.0000 meq | EXTENDED_RELEASE_TABLET | Freq: Once | ORAL | Status: AC
  Administered 2021-06-06: 40 meq via ORAL
  Filled 2021-06-06: qty 2

## 2021-06-06 MED ORDER — SODIUM CHLORIDE 0.9% FLUSH: 3.0000 mL | INTRAVENOUS | Status: DC | PRN

## 2021-06-06 MED ORDER — RIVAROXABAN 20 MG PO TABS
20.0000 mg | ORAL_TABLET | Freq: Every day | ORAL | Status: DC
  Administered 2021-06-06 – 2021-06-08 (×3): 20 mg via ORAL
  Filled 2021-06-06 (×3): qty 1

## 2021-06-06 MED ORDER — GLIPIZIDE ER 5 MG PO TB24
10.0000 mg | ORAL_TABLET | Freq: Every day | ORAL | Status: DC
  Administered 2021-06-07 – 2021-06-09 (×3): 10 mg via ORAL
  Filled 2021-06-06 (×3): qty 2

## 2021-06-06 MED ORDER — DILTIAZEM HCL ER COATED BEADS 180 MG PO CP24: 180.0000 mg | ORAL_CAPSULE | Freq: Every day | ORAL | Status: DC

## 2021-06-06 MED ORDER — DOFETILIDE 500 MCG PO CAPS
500.0000 ug | ORAL_CAPSULE | Freq: Two times a day (BID) | ORAL | Status: DC
  Administered 2021-06-06: 500 ug via ORAL
  Filled 2021-06-06: qty 1

## 2021-06-06 NOTE — Progress Notes (Signed)
Primary Care Physician: Patient, No Pcp Per (Inactive) Primary Cardiologist: Dr Darrold Junker Primary Electrophysiologist: Dr Lalla Brothers Referring Physician: Dr Faythe Dingwall is a 60 y.o. male with a history of CAD, DM, hep C treated, HTN, chronic systolic and diastolic CHF, and atrial fibrillation who presents for follow up in the [facility]. Patient was seen remotely by Dr Graciela Husbands and had previously been on dofetilide. He was lost to follow up and was no longer taking several cardiac medications. He was seen by Dr Lalla Brothers 05/11/21 who recommended reloading dofetilide. Patient is on Xarelto for a CHADS2VASC score of 4. He does report about 2 weeks ago he forgot his dose of Xarelto but did take in the next morning. No omitted doses. He does have symptoms of heart racing today.   Today, he denies symptoms of chest pain, shortness of breath, orthopnea, PND, lower extremity edema, dizziness, presyncope, syncope, snoring, daytime somnolence, bleeding, or neurologic sequela. The patient is tolerating medications without difficulties and is otherwise without complaint today.    Atrial Fibrillation Risk Factors:  he does have symptoms or diagnosis of sleep apnea. he does not have a history of rheumatic fever.   he has a BMI of Body mass index is 31.17 kg/m.Marland Kitchen Filed Weights   06/06/21 1102  Weight: 113.1 kg    Family History  Problem Relation Age of Onset   Other Mother 109       died from complications of gastric bypass surgery   Thyroid disease Mother    Alzheimer's disease Father    COPD Father    Hypertension Father    Hyperlipidemia Father    Heart disease Father      Atrial Fibrillation Management history:  Previous antiarrhythmic drugs: dofetilide  Previous cardioversions: 2020 Previous ablations: none CHADS2VASC score: 4 Anticoagulation history: Xarelto   Past Medical History:  Diagnosis Date   Atrial fibrillation (HCC)    Atrial  fibrillation (HCC)    CHF (congestive heart failure) (HCC)    Coronary artery disease 2016   1 stent   Diabetes mellitus without complication [facility])    ED (erectile dysfunction)    Hepatitis C    Hypertension    Obesity    Past Surgical History:  Procedure Laterality Date   CARDIOVERSION N/A 01/08/2019   Procedure: CARDIOVERSION (CATH LAB);  Surgeon: Marcina Millard, MD;  Location: [facility] ORS;  Service: Cardiovascular;  Laterality: N/A;   CORONARY STENT PLACEMENT  2016    Current Outpatient Medications  Medication Sig Dispense Refill   aspirin 81 MG EC tablet Take 81 mg by mouth daily.     diltiazem (CARDIZEM CD) 180 MG 24 hr capsule Take 180 mg by mouth daily.     Dulaglutide (TRULICITY) 1.5 MG/0.5ML SOPN INJECT 0.5 MLS (1.5 MG TOTAL) SUBCUTANEOUSLY EVERY 7 (SEVEN) DAYS FOR 30 DAYS     glipiZIDE (GLUCOTROL XL) 10 MG 24 hr tablet Take 1 tablet by mouth daily.     metoprolol succinate (TOPROL-XL) 100 MG 24 hr tablet Take 1 tablet (100 mg total) by mouth daily. Take with or immediately following a meal. 90 tablet 3   rivaroxaban (XARELTO) 20 MG TABS tablet Take 20 mg by mouth daily in the afternoon.     torsemide (DEMADEX) 20 MG tablet Take 40 mg by mouth daily.     No current facility-administered medications for this encounter.    Allergies  Allergen Reactions   Pseudoephedrine     starts him in  A fib    Social History   Socioeconomic History   Marital status: Married    Spouse name: Not on file   Number of children: Not on file   Years of education: Not on file   Highest education level: Not on file  Occupational History   Not on file  Tobacco Use   Smoking status: Every Day    Packs/day: 0.50    Pack years: 0.00    Types: Cigarettes   Smokeless tobacco: Never  Vaping Use   Vaping Use: Never used  Substance and Sexual Activity   Alcohol use: No   Drug use: Not Currently    Types: Marijuana    Comment: as a teen   Sexual activity: Not Currently  Other  Topics Concern   Not on file  Social History Narrative   Not on file   Social Determinants of Health   Financial Resource Strain: Not on file  Food Insecurity: Not on file  Transportation Needs: Not on file  Physical Activity: Not on file  Stress: Not on file  Social Connections: Not on file  Intimate Partner Violence: Not on file     ROS- All systems are reviewed and negative except as per the HPI above.  Physical Exam: Vitals:   06/06/21 1102  BP: (!) 146/78  Pulse: (!) 118  Weight: 113.1 kg  Height: 6\' 3"  (1.905 m)    GEN- The patient is a well appearing obese male, alert and oriented x 3 today.   Head- normocephalic, atraumatic Eyes-  Sclera clear, conjunctiva pink Ears- hearing intact Oropharynx- clear Neck- supple  Lungs- Clear to ausculation bilaterally, normal work of breathing Heart- irregular rate and rhythm, no murmurs, rubs or gallops  GI- soft, NT, ND, + BS Extremities- no clubbing, cyanosis, or edema MS- no significant deformity or atrophy Skin- no rash or lesion Psych- euthymic mood, full affect Neuro- strength and sensation are intact  Wt Readings from Last 3 Encounters:  06/06/21 113.1 kg  05/11/21 108.4 kg  03/09/20 108.9 kg    EKG today demonstrates  Afib, PVC Vent. rate 118 BPM PR interval * ms QRS duration 94 ms QT/QTcB 366/513 ms  Echo 12//19 demonstrated  - Left ventricle: Systolic function was severely reduced. The    estimated ejection fraction was in the range of 20% to 25%.  - Mitral valve: There was moderate regurgitation.  - Right ventricle: The cavity size was mildly dilated. Systolic    pressure was increased.  - Right atrium: The atrium was mildly dilated.  - Tricuspid valve: There was moderate regurgitation.  - Pulmonary arteries: PA peak pressure: 40 mm Hg (S).   Epic records are reviewed at length today  CHA2DS2-VASc Score = 4  The patient's score is based upon: CHF History: Yes HTN History: Yes Diabetes  History: Yes Stroke History: No Vascular Disease History: Yes Age Score: 0 Gender Score: 0     ASSESSMENT AND PLAN: 1. Persistent Atrial Fibrillation (ICD10:  I48.19) The patient's CHA2DS2-VASc score is 4, indicating a 4.8% annual risk of stroke.   Patient presents for dofetilide admission. Patient aware of price of dofetilide. Continue Xarelto 20 mg daily. In d/w patient and Dr 10-24-1988, OK to be admitted for dofetilide given late dose and no missed doses.  No recent benadryl use PharmD has screened medications Labs today show creatinine at 0.78, K+ 3.8 and mag 2.0, CrCl calculated at 161 mL/min Continue diltiazem 180 mg daily Continue Toprol 100 mg daily  2. Secondary Hypercoagulable State (ICD10:  D68.69) The patient is at significant risk for stroke/thromboembolism based upon his CHA2DS2-VASc Score of 4.  Continue Rivaroxaban (Xarelto).   3. Obesity Body mass index is 31.17 kg/m. Lifestyle modification was discussed at length including regular exercise and weight reduction.  4. Chronic combined systolic and diastolic CHF EF 69-62% No signs or symptoms of fluid overload today.  5. CAD No anginal symptoms.  6. HTN Stable, no changes today.   To be admitted later today once a bed becomes available.    Jorja Loa PA-C [facility] [address] [place], [place] [address] [phone] 06/06/2021 11:37 AM

## 2021-06-06 NOTE — Progress Notes (Signed)
Pharmacy: Dofetilide (Tikosyn) - Initial Consult Assessment and Electrolyte Replacement  Pharmacy consulted to assist in monitoring and replacing electrolytes in this 60 y.o. male admitted on 06/06/2021 undergoing dofetilide initiation. First dofetilide dose planned for 06/06/21.   Assessment:  Patient Exclusion Criteria: If any screening criteria checked as "Yes", then  patient  should NOT receive dofetilide until criteria item is corrected.  If "Yes" please indicate correction plan.  YES  NO Patient  Exclusion Criteria Correction Plan   [x]   []   Baseline QTc interval is greater than or equal to 440 msec. IF above YES box checked dofetilide contraindicated unless patient has ICD; then may proceed if QTc 500-550 msec or with known ventricular conduction abnormalities may proceed with QTc 550-600 msec. QTc =  MD aware and will recheck on admit and follow   []   [x]   Patient is known or suspected to have a digoxin level greater than 2 ng/ml: No results found for: DIGOXIN     []   [x]   Creatinine clearance less than 20 ml/min (calculated using Cockcroft-Gault, actual body weight and serum creatinine): Estimated Creatinine Clearance: 133.1 mL/min (by C-G formula based on SCr of 0.78 mg/dL).     []   [x]  Patient has received drugs known to prolong the QT intervals within the last 48 hours (phenothiazines, tricyclics or tetracyclic antidepressants, erythromycin, H-1 antihistamines, cisapride, fluoroquinolones, azithromycin, ondansetron).   Updated information on QT prolonging agents is available to be searched on the following database:QT prolonging agents     []   [x]   Patient received a dose of hydrochlorothiazide (Oretic) alone or in any combination including triamterene (Dyazide, Maxzide) in the last 48 hours.    []   [x]  Patient received a medication known to increase dofetilide plasma concentrations prior to initial dofetilide dose:  Trimethoprim (Primsol, Proloprim) in the  last 36 hours Verapamil (Calan, Verelan) in the last 36 hours or a sustained release dose in the last 72 hours Megestrol (Megace) in the last 5 days  Cimetidine (Tagamet) in the last 6 hours Ketoconazole (Nizoral) in the last 24 hours Itraconazole (Sporanox) in the last 48 hours  Prochlorperazine (Compazine) in the last 36 hours     []   [x]   Patient is known to have a history of torsades de pointes; congenital or acquired long QT syndromes.    []   [x]   Patient has received a Class 1 antiarrhythmic with less than 2 half-lives since last dose. (Disopyramide, Quinidine, Procainamide, Lidocaine, Mexiletine, Flecainide, Propafenone)    []   [x]   Patient has received amiodarone therapy in the past 3 months or amiodarone level is greater than 0.3 ng/ml.    Patient has been appropriately anticoagulated with xarelto.  Labs:    Component Value Date/Time   K 3.8 06/06/2021 1230   MG 2.0 06/06/2021 1230     Plan: Potassium: K 3.8-3.9:  Hold Tikosyn initiation and give KCl 40 mEq po x1 then begin Tikosyn at least 2hr after KCl dose - do not need to recheck K   Magnesium: Mg 1.8-2: Give Mg 2 gm IV x1 to prevent Mg from dropping below 1.8 - do not need to recheck Mg. Appropriate to initiate Tikosyn   Thank you for allowing pharmacy to participate in this patient's care   PharmD., BCPS Clinical Pharmacist 06/06/2021 2:01 PM

## 2021-06-06 NOTE — H&P (Signed)
Primary Care Physician: Patient, No Pcp Per (Inactive) Primary Cardiologist: Dr Darrold Junker Primary Electrophysiologist: Dr Lalla Brothers Referring Physician: Dr Faythe Dingwall is a 60 y.o. male with a history of CAD, DM, hep C treated, HTN, chronic systolic and diastolic CHF, and atrial fibrillation who presents for follow up in the [facility]. Patient was seen remotely by Dr Graciela Husbands and had previously been on dofetilide. He was lost to follow up and was no longer taking several cardiac medications. He was seen by Dr Lalla Brothers 05/11/21 who recommended reloading dofetilide. Patient is on Xarelto for a CHADS2VASC score of 4. He does report about 2 weeks ago he forgot his dose of Xarelto but did take in the next morning. No omitted doses. He does have symptoms of heart racing today.    Today, he denies symptoms of chest pain, shortness of breath, orthopnea, PND, lower extremity edema, dizziness, presyncope, syncope, snoring, daytime somnolence, bleeding, or neurologic sequela. The patient is tolerating medications without difficulties and is otherwise without complaint today.     Atrial Fibrillation Risk Factors:   he does have symptoms or diagnosis of sleep apnea. he does not have a history of rheumatic fever.     he has a BMI of Body mass index is 31.17 kg/m.Marland Kitchen    Filed Weights    06/06/21 1102  Weight: 113.1 kg           Family History  Problem Relation Age of Onset   Other Mother 10        died from complications of gastric bypass surgery   Thyroid disease Mother     Alzheimer's disease Father     COPD Father     Hypertension Father     Hyperlipidemia Father     Heart disease Father          Atrial Fibrillation Management history:   Previous antiarrhythmic drugs: dofetilide  Previous cardioversions: 2020 Previous ablations: none CHADS2VASC score: 4 Anticoagulation history: Xarelto         Past Medical History:  Diagnosis Date    Atrial fibrillation (HCC)     Atrial fibrillation (HCC)     CHF (congestive heart failure) (HCC)     Coronary artery disease 2016    1 stent   Diabetes mellitus without complication [facility])     ED (erectile dysfunction)     Hepatitis C     Hypertension     Obesity           Past Surgical History:  Procedure Laterality Date   CARDIOVERSION N/A 01/08/2019    Procedure: CARDIOVERSION (CATH LAB);  Surgeon: Marcina Millard, MD;  Location: [facility] ORS;  Service: Cardiovascular;  Laterality: N/A;   CORONARY STENT PLACEMENT   2016            Current Outpatient Medications  Medication Sig Dispense Refill   aspirin 81 MG EC tablet Take 81 mg by mouth daily.       diltiazem (CARDIZEM CD) 180 MG 24 hr capsule Take 180 mg by mouth daily.       Dulaglutide (TRULICITY) 1.5 MG/0.5ML SOPN INJECT 0.5 MLS (1.5 MG TOTAL) SUBCUTANEOUSLY EVERY 7 (SEVEN) DAYS FOR 30 DAYS       glipiZIDE (GLUCOTROL XL) 10 MG 24 hr tablet Take 1 tablet by mouth daily.       metoprolol succinate (TOPROL-XL) 100 MG 24 hr tablet Take 1 tablet (100 mg total) by mouth daily.  Take with or immediately following a meal. 90 tablet 3   rivaroxaban (XARELTO) 20 MG TABS tablet Take 20 mg by mouth daily in the afternoon.       torsemide (DEMADEX) 20 MG tablet Take 40 mg by mouth daily.        No current facility-administered medications for this encounter.           Allergies  Allergen Reactions   Pseudoephedrine        starts him in A fib      Social History         Socioeconomic History   Marital status: Married      Spouse name: Not on file   Number of children: Not on file   Years of education: Not on file   Highest education level: Not on file  Occupational History   Not on file  Tobacco Use   Smoking status: Every Day      Packs/day: 0.50      Pack years: 0.00      Types: Cigarettes   Smokeless tobacco: Never  Vaping Use   Vaping Use: Never used  Substance and Sexual Activity   Alcohol use: No   Drug use:  Not Currently      Types: Marijuana      Comment: as a teen   Sexual activity: Not Currently  Other Topics Concern   Not on file  Social History Narrative   Not on file    Social Determinants of Health    Financial Resource Strain: Not on file  Food Insecurity: Not on file  Transportation Needs: Not on file  Physical Activity: Not on file  Stress: Not on file  Social Connections: Not on file  Intimate Partner Violence: Not on file        ROS- All systems are reviewed and negative except as per the HPI above.   Physical Exam:    Vitals:    06/06/21 1102  BP: (!) 146/78  Pulse: (!) 118  Weight: 113.1 kg  Height: 6\' 3"  (1.905 m)      GEN- The patient is a well appearing obese male, alert and oriented x 3 today.   Head- normocephalic, atraumatic Eyes-  Sclera clear, conjunctiva pink Ears- hearing intact Oropharynx- clear Neck- supple Lungs- Clear to ausculation bilaterally, normal work of breathing Heart- irregular rate and rhythm, no murmurs, rubs or gallops GI- soft, NT, ND, + BS Extremities- no clubbing, cyanosis, or edema MS- no significant deformity or atrophy Skin- no rash or lesion Psych- euthymic mood, full affect Neuro- strength and sensation are intact      Wt Readings from Last 3 Encounters:  06/06/21 113.1 kg  05/11/21 108.4 kg  03/09/20 108.9 kg      EKG today demonstrates  Afib, PVC Vent. rate 118 BPM PR interval * ms QRS duration 94 ms QT/QTcB 366/513 ms   Echo 12//19 demonstrated - Left ventricle: Systolic function was severely reduced. The    estimated ejection fraction was in the range of 20% to 25%.  - Mitral valve: There was moderate regurgitation.  - Right ventricle: The cavity size was mildly dilated. Systolic    pressure was increased.  - Right atrium: The atrium was mildly dilated.  - Tricuspid valve: There was moderate regurgitation.  - Pulmonary arteries: PA peak pressure: 40 mm Hg (S).   Epic records are reviewed at length  today   CHA2DS2-VASc Score = 4  The patient's score is based upon:  CHF History: Yes HTN History: Yes Diabetes History: Yes Stroke History: No Vascular Disease History: Yes Age Score: 0 Gender Score: 0       ASSESSMENT AND PLAN: 1. Persistent Atrial Fibrillation (ICD10:  I48.19) The patient's CHA2DS2-VASc score is 4, indicating a 4.8% annual risk of stroke.   Patient presents for dofetilide admission. Patient aware of price of dofetilide. Continue Xarelto 20 mg daily. In d/w patient and Dr Lalla Brothers, OK to be admitted for dofetilide given late dose and no missed doses.  No recent benadryl use PharmD has screened medications Labs today show creatinine at 0.78, K+ 3.8 and mag 2.0, CrCl calculated at 161 mL/min Continue diltiazem 180 mg daily Continue Toprol 100 mg daily   2. Secondary Hypercoagulable State (ICD10:  D68.69) The patient is at significant risk for stroke/thromboembolism based upon his CHA2DS2-VASc Score of 4.  Continue Rivaroxaban (Xarelto).    3. Obesity Body mass index is 31.17 kg/m. Lifestyle modification was discussed at length including regular exercise and weight reduction.   4. Chronic combined systolic and diastolic CHF EF 56-81% Mild volume overload today.   5. CAD No anginal symptoms.   6. HTN Stable, no changes today.      ----------------------------------------------------------------------------------  I have seen, examined the patient, and reviewed the above assessment and plan.    Patient is admitted for persistent atrial fibrillation.  Plan for Tikosyn loading.  He also has chronic combined systolic and diastolic heart failure.  1.  Persistent atrial fibrillation In A. fib today with rapid ventricular rates.  Plan for dofetilide 500 mcg twice daily with careful monitoring of QTC.  Today's QTC is difficult to interpret based on rapid ventricular rates but I suspect it is around 480 ms based on previous EKGs.  Previous sinus EKGs have had  an acceptable QTC.  On Xarelto for stroke prophylaxis.  2.  Chronic systolic and diastolic heart failure Ejection fraction 20%.  NYHA class II-III.  Warm and slightly volume overloaded on exam. We will give him his home torsemide.  May need to give a dose of IV Lasix pending physical exam tomorrow. Continue Toprol-XL Continue torsemide I will plan to review his blood pressure trend and consider starting Entresto tomorrow.  3.  Coronary artery disease No anginal symptoms today  4.  Obesity Weight loss encouraged.   Lanier Prude, MD 06/06/2021 4:42 PM

## 2021-06-06 NOTE — Progress Notes (Signed)
Paged regarding [name]'s QTc overnight.  ECG reviewed.  Has lengthened significantly from presentation (596 ms) with frequent PVCs, one in late repolarization period. Hold dofetilide.  4 grams magnesium.  Can review reduced dosing or alternative agent tomorrow with admitting electrophysiologist.  Luanna Salk, MD Cardiology Fellow [facility]

## 2021-06-06 NOTE — TOC Benefit Eligibility Note (Signed)
Patient Advocate Encounter   Received notification that prior authorization for Dofetilide (Tikosyn) 500 mcg is required.   PA submitted on 06/06/2021 Key [id number] Status is pending       [name], CPhT Pharmacy Patient Advocate Specialist [facility] Antimicrobial Stewardship Team Direct Number: [phone]  Fax: [phone]

## 2021-06-06 NOTE — TOC Benefit Eligibility Note (Signed)
Patient Product/process development scientist completed.    The patient is currently admitted and upon discharge could be taking Entresto 24mg /26mg .  The current 30 day co-pay is, $60.43.   The patient is currently admitted and upon discharge could be taking 10mg .  The current 30 day co-pay is, $15.00.   The patient is insured through [place]    , CPhT Pharmacy Patient Advocate Specialist [facility] Antimicrobial Stewardship Team Direct Number: [phone]  Fax: [phone]

## 2021-06-06 NOTE — Progress Notes (Signed)
   06/06/21 1350  Assess: MEWS Score  Temp 98.5 F (36.9 C)  BP 112/72  Pulse Rate (!) 123  ECG Heart Rate (!) 137  Resp 16  Level of Consciousness Alert  SpO2 92 %  O2 Device Room Air  Assess: MEWS Score  MEWS Temp 0  MEWS Systolic 0  MEWS Pulse 3  MEWS RR 0  MEWS LOC 0  MEWS Score 3  MEWS Score Color Yellow  Assess: if the MEWS score is Yellow or Red  Were vital signs taken at a resting state? Yes  Focused Assessment No change from prior assessment  Early Detection of Sepsis Score *See Row Information* Medium  MEWS guidelines implemented *See Row Information* Yes  Treat  MEWS Interventions Other (Comment) (Admittted d/t Afib)  Pain Scale 0-10  Pain Score 0  Take Vital Signs  Increase Vital Sign Frequency  Yellow: Q 2hr X 2 then Q 4hr X 2, if remains yellow, continue Q 4hrs  Escalate  MEWS: Escalate Yellow: discuss with charge nurse/RN and consider discussing with provider and RRT  Notify: Charge Nurse/RN  Name of Charge Nurse/RN Notified Jeannene Patella  Date Charge Nurse/RN Notified 06/06/21  Time Charge Nurse/RN Notified 1355  Document  Patient Outcome Other (Comment) (Admitted d/t Afib)  Progress note created (see row info) Yes

## 2021-06-07 ENCOUNTER — Inpatient Hospital Stay (HOSPITAL_COMMUNITY): Payer: BC Managed Care – PPO

## 2021-06-07 ENCOUNTER — Other Ambulatory Visit (HOSPITAL_COMMUNITY): Payer: Self-pay

## 2021-06-07 DIAGNOSIS — I428 Other cardiomyopathies: Secondary | ICD-10-CM | POA: Diagnosis not present

## 2021-06-07 LAB — BASIC METABOLIC PANEL
Anion gap: 11 (ref 5–15)
BUN: 16 mg/dL (ref 6–20)
CO2: 27 mmol/L (ref 22–32)
Calcium: 8.6 mg/dL — ABNORMAL LOW (ref 8.9–10.3)
Chloride: 99 mmol/L (ref 98–111)
Creatinine, Ser: 0.9 mg/dL (ref 0.61–1.24)
GFR, Estimated: >60 mL/min (ref >60)
Glucose, Bld: 225 mg/dL — ABNORMAL HIGH (ref 70–99)
Potassium: 4.1 mmol/L (ref 3.5–5.1)
Sodium: 137 mmol/L (ref 135–145)

## 2021-06-07 LAB — ECHOCARDIOGRAM COMPLETE
Area-P 1/2: 3.85 cm2
Height: 75 in
S' Lateral: 4.6 cm
Weight: 3975.33 oz

## 2021-06-07 LAB — MAGNESIUM: Magnesium: 2.6 mg/dL — ABNORMAL HIGH (ref 1.7–2.4)

## 2021-06-07 MED ORDER — AMIODARONE HCL IN DEXTROSE 360-4.14 MG/200ML-% IV SOLN
30.0000 mg/h | INTRAVENOUS | Status: DC
  Administered 2021-06-07 – 2021-06-09 (×4): 30 mg/h via INTRAVENOUS
  Filled 2021-06-07 (×4): qty 200

## 2021-06-07 MED ORDER — SACUBITRIL-VALSARTAN 24-26 MG PO TABS
1.0000 | ORAL_TABLET | Freq: Two times a day (BID) | ORAL | Status: DC
  Administered 2021-06-07 – 2021-06-09 (×5): 1 via ORAL
  Filled 2021-06-07 (×5): qty 1

## 2021-06-07 MED ORDER — AMIODARONE LOAD VIA INFUSION
150.0000 mg | Freq: Once | INTRAVENOUS | Status: AC
  Administered 2021-06-07: 150 mg via INTRAVENOUS
  Filled 2021-06-07: qty 83.34

## 2021-06-07 MED ORDER — AMIODARONE HCL IN DEXTROSE 360-4.14 MG/200ML-% IV SOLN
60.0000 mg/h | INTRAVENOUS | Status: DC
  Administered 2021-06-07 (×2): 60 mg/h via INTRAVENOUS
  Filled 2021-06-07 (×2): qty 200

## 2021-06-07 MED ORDER — ATORVASTATIN CALCIUM 80 MG PO TABS
80.0000 mg | ORAL_TABLET | Freq: Every day | ORAL | Status: DC
  Administered 2021-06-07 – 2021-06-08 (×2): 80 mg via ORAL
  Filled 2021-06-07 (×2): qty 1

## 2021-06-07 NOTE — TOC Benefit Eligibility Note (Signed)
Patient Advocate Encounter  Prior Authorization for Dofetilide (Tikosyn) 500 mcg has been approved.    PA# [id number] Effective dates: 06/06/2021 through 06/06/2022  Patients co-pay is $22.38.     Roland Earl, CPhT Pharmacy Patient Advocate Specialist [facility] Antimicrobial Stewardship Team Direct Number: [phone]  Fax: [phone]

## 2021-06-07 NOTE — Progress Notes (Signed)
  Echocardiogram 2D Echocardiogram has been performed.  [name] 06/07/2021, 2:40 PM

## 2021-06-07 NOTE — Progress Notes (Signed)
Orthopedic Tech Progress Note Patient Details:  [name] Sep 20, 1961 [id number]  Ortho Devices Type of Ortho Device: Radio broadcast assistant Ortho Device/Splint Location: bil Ortho Device/Splint Interventions: Ordered, Application   Post Interventions Patient Tolerated: Well Instructions Provided: Care of device  Unna boots applied bil after RN inspected wounds.  She did not have the aquacel yet and agreed xeroform gaze would be fine for today.    Pt tolerated well and educated on removing in ~3 days (if he discharges before ortho tech comes back to change).  Thanks,  [name], PT, DPT  Acute Rehabilitation Ortho Tech Supervisor [phone] pager #[phone] office    [name] 06/07/2021, 5:13 PM

## 2021-06-07 NOTE — Progress Notes (Signed)
CCMD called to report that patient had a 2.24 sec pause at 0406.  Patient is asymptomatic at this time.  Will continue to monitor.  Darrick Grinder, RN

## 2021-06-07 NOTE — Consult Note (Signed)
WOC team requested to assess BLE wounds today.  WOC team is currently working on another campus; please post photo in the EMR if possible and we can perform the consult remotely.  Secure chat message sent to primary team. Thank-you Cammie Mcgee MSN, RN, CWOCN, CWCN-AP, CNS [phone]

## 2021-06-07 NOTE — Consult Note (Signed)
WOC consulted for LE topical care and compression therapy.  This patient is followed in the [facility] wound care clinic.  I have updated orders based on his established POC.  Notified bedside nursing staff of same.   Wound Care Right and Left lower extremities: Cleanse wound(s) with warm, soapy water. Rinse and pat dry. Do not soak/submerge in water Apply Aquacel Ag+ [place] # [phone]) to open areas Contact orthopedic tech to apply Unna's boots bilaterally Change weekly beginning 06/07/21    Bedside nursing can provide topical care per established POC From the [facility].  Orthopedic techs apply Unna's boots on the [facility] campus.  Bedside nursing to contact orthopedic tech to reapply once topical care has been provided.   Re consult if needed, will not follow at this time. Thanks  [employer]os [employer], RN,CWOCN, CNS, CWON-AP [phone])

## 2021-06-07 NOTE — Progress Notes (Signed)
EP Progress Note  Patient Name: [name] Date of Encounter: 06/07/2021  [facility] Cardiologist: Lanier Prude   Subjective   NAEO. Swelling improved.  Inpatient Medications    Scheduled Meds:  amiodarone  150 mg Intravenous Once   aspirin EC  81 mg Oral Daily   glipiZIDE  10 mg Oral Daily   metoprolol succinate  100 mg Oral Daily   rivaroxaban  20 mg Oral Q supper   sacubitril-valsartan  1 tablet Oral BID   sodium chloride flush  3 mL Intravenous Q12H   torsemide  40 mg Oral Daily   Continuous Infusions:  sodium chloride     amiodarone     Followed by   amiodarone     PRN Meds: sodium chloride, sodium chloride flush   Vital Signs    Vitals:   06/06/21 1756 06/06/21 1800 06/06/21 2225 06/07/21 0500  BP: 95/73 95/73 113/63 113/78  Pulse: 65 77 87 65  Resp:  17 15 19   Temp: 97.6 F (36.4 C)  98.4 F (36.9 C) (!) 97.5 F (36.4 C)  TempSrc: Oral  Oral Oral  SpO2: 97% 97% 99% 98%  Weight:      Height:        Intake/Output Summary (Last 24 hours) at 06/07/2021 0749 Last data filed at 06/06/2021 1753 Gross per 24 hour  Intake 50 ml  Output --  Net 50 ml   Last 3 Weights 06/06/2021 06/06/2021 05/11/2021  Weight (lbs) 248 lb 7.3 oz 249 lb 6.4 oz 239 lb  Weight (kg) 112.7 kg 113.127 kg 108.41 kg      Telemetry    Atrial fibrillation - Personally Reviewed  ECG    AF. Qtc approaching 05/13/2021 this AM - Personally Reviewed  Physical Exam   GEN: No acute distress.   Neck: No JVD Cardiac: RRR, no murmurs, rubs, or gallops.  Respiratory: Clear to auscultation bilaterally. GI: Soft, nontender, non-distended  MS: No edema; No deformity. Neuro:  Nonfocal  Psych: Normal affect   Labs    High Sensitivity Troponin:  No results for input(s): TROPONINIHS in the last 720 hours.    Chemistry Recent Labs  Lab 06/06/21 1230 06/07/21 0152  NA 138 137  K 3.8 4.1  CL 99 99  CO2 28 27  GLUCOSE 177* 225*  BUN 8 16  CREATININE 0.78 0.90   CALCIUM 8.5* 8.6*  GFRNONAA >60 >60  ANIONGAP 11 11     HematologyNo results for input(s): WBC, RBC, HGB, HCT, MCV, MCH, MCHC, RDW, PLT in the last 168 hours.  BNPNo results for input(s): BNP, PROBNP in the last 168 hours.   DDimer No results for input(s): DDIMER in the last 168 hours.   Radiology    No results found.  Cardiac Studies   03/14/2021 Echo (care everywhere) Summary    1. Technically difficult study..    2. Echo contrast utilized to enhance endocardial border definition.    3. The left ventricle is mildly dilated in size with normal wall thickness.    4. The left ventricular systolic function is severely decreased, LVEF is  visually estimated at 25-30%.    5. The right ventricle is not well visualized but probably normal in size,  with low normal systolic function.    6. There is mild mitral valve regurgitation.    7. There is mild tricuspid regurgitation.    8. The left atrium is moderately dilated in size.    9. Estimated pulmonary artery  systolic pressure is + RA pressure (IVC  not well visualized)..        Assessment & Plan    Mr Brathwaite is a 60yo man with history of CAD, DM, HCV treated, HTN, chronic combined systolic and diastolic HF and AF with rapid ventricular rates who initially presented 06/06/2021 for dofetilide loading. After the first dose of dofetilide, the Qtc was significantly prolonged. Given his multiple comorbidities, we have changed him to amiodarone IV for loading.  #persistent AF On xarelto for stroke ppx. Uncontrolled ventricular rates likely contributing to his worsened cardiomyopathy. Will repeat echo now given most recent not in our system. Plan to start iv amio load today.  Check cmp, tsh, free t4 for baseline labs tomorrow morning. Tentatively planning for DCCV Thursday.  #chronic combined systolic and diastolic HF EF last around 30% at [facility] Repeat TTE this admission to establish baseline Continue torsemide Continue  metoprolol Start entresto low dose today  #CAD No anginal symptoms  Plan discussed with patient who is in agreement.  For questions or updates, please contact [facility] Please consult [email or web address] for contact info under        Signed, Lanier Prude, MD  06/07/2021, 7:49 AM

## 2021-06-07 NOTE — Progress Notes (Signed)
Patient got up to use the bathroom and the bed alarm went off. Patient became very upset , and starting cuzzing because he did not want the bed alarm on. Attempted to education patient on the importance of safety during his hospital stay.  Patient still refused to have the bed alarm on. Will continue to monitor.   Darrick Grinder, RN

## 2021-06-08 LAB — COMPREHENSIVE METABOLIC PANEL
ALT: 42 U/L (ref 0–44)
AST: 36 U/L (ref 15–41)
Albumin: 3 g/dL — ABNORMAL LOW (ref 3.5–5.0)
Alkaline Phosphatase: 51 U/L (ref 38–126)
Anion gap: 9 (ref 5–15)
BUN: 14 mg/dL (ref 6–20)
CO2: 28 mmol/L (ref 22–32)
Calcium: 8.6 mg/dL — ABNORMAL LOW (ref 8.9–10.3)
Chloride: 100 mmol/L (ref 98–111)
Creatinine, Ser: 0.71 mg/dL (ref 0.61–1.24)
GFR, Estimated: >60 mL/min (ref >60)
Glucose, Bld: 156 mg/dL — ABNORMAL HIGH (ref 70–99)
Potassium: 3.7 mmol/L (ref 3.5–5.1)
Sodium: 137 mmol/L (ref 135–145)
Total Bilirubin: 1.3 mg/dL — ABNORMAL HIGH (ref 0.3–1.2)
Total Protein: 6.6 g/dL (ref 6.5–8.1)

## 2021-06-08 LAB — LIPID PANEL
Cholesterol: 143 mg/dL (ref 0–200)
HDL: 44 mg/dL (ref >40)
LDL Cholesterol: 82 mg/dL (ref 0–99)
Total CHOL/HDL Ratio: 3.3 RATIO
Triglycerides: 83 mg/dL (ref <150)
VLDL: 17 mg/dL (ref 0–40)

## 2021-06-08 LAB — HEMOGLOBIN A1C
Hgb A1c MFr Bld: 9.1 % — ABNORMAL HIGH (ref 4.8–5.6)
Mean Plasma Glucose: 214 mg/dL

## 2021-06-08 LAB — TSH: TSH: 3.113 u[IU]/mL (ref 0.350–4.500)

## 2021-06-08 LAB — T4, FREE: Free T4: 0.96 ng/dL (ref 0.61–1.12)

## 2021-06-08 MED ORDER — POTASSIUM CHLORIDE CRYS ER 20 MEQ PO TBCR
40.0000 meq | EXTENDED_RELEASE_TABLET | Freq: Once | ORAL | Status: AC
  Administered 2021-06-08: 40 meq via ORAL
  Filled 2021-06-08: qty 2

## 2021-06-08 NOTE — Progress Notes (Signed)
Telemetry remains AFib 70's generally NPO after MN for DCCV Orders in I don't see him on the posted schedule though confirmed with endo, he is for 10"30AM tomorrow with Dr. Carla Drape, PA-C

## 2021-06-08 NOTE — Progress Notes (Signed)
EP Progress Note  Patient Name: [name] Date of Encounter: 06/08/2021  [facility] Cardiologist: Lanier Prude   Subjective   NAEO. Tolerating entresto. Labwork today acceptable for amiodarone. Tele with significant noise artifact this AM.  Inpatient Medications    Scheduled Meds:  aspirin EC  81 mg Oral Daily   atorvastatin  80 mg Oral QHS   glipiZIDE  10 mg Oral Daily   metoprolol succinate  100 mg Oral Daily   rivaroxaban  20 mg Oral Q supper   sacubitril-valsartan  1 tablet Oral BID   sodium chloride flush  3 mL Intravenous Q12H   torsemide  40 mg Oral Daily   Continuous Infusions:  sodium chloride     amiodarone 30 mg/hr (06/07/21 1907)   PRN Meds: sodium chloride, sodium chloride flush   Vital Signs    Vitals:   06/07/21 1739 06/07/21 2030 06/08/21 0001 06/08/21 0443  BP: 112/64 120/72 138/80 102/67  Pulse: 79 71 90 80  Resp: 10 11 14 16   Temp: 97.8 F (36.6 C) 98.5 F (36.9 C) 97.9 F (36.6 C) 98.1 F (36.7 C)  TempSrc: Oral Oral Oral Oral  SpO2: 98% 94% 98% 93%  Weight:      Height:        Intake/Output Summary (Last 24 hours) at 06/08/2021 0612 Last data filed at 06/07/2021 0948 Gross per 24 hour  Intake 484.62 ml  Output --  Net 484.62 ml    Last 3 Weights 06/06/2021 06/06/2021 05/11/2021  Weight (lbs) 248 lb 7.3 oz 249 lb 6.4 oz 239 lb  Weight (kg) 112.7 kg 113.127 kg 108.41 kg      Telemetry    More regular this AM. Unclear if P waves because of significant artifact. - Personally Reviewed  ECG    No new - Personally Reviewed  Physical Exam   GEN: No acute distress.  In bed at 30 degrees Neck: No JVD Cardiac: irregular rhythm, no murmurs, rubs, or gallops.  Respiratory: Clear to auscultation bilaterally. GI: Soft, nontender, non-distended  MS: No edema; No deformity. Feet wrapped. Neuro:  Nonfocal  Psych: Normal affect   Labs    High Sensitivity Troponin:  No results for input(s): TROPONINIHS in the last  720 hours.    Chemistry Recent Labs  Lab 06/06/21 1230 06/07/21 0152 06/08/21 0315  NA 138 137 137  K 3.8 4.1 3.7  CL 99 99 100  CO2 28 27 28   GLUCOSE 177* 225* 156*  BUN 8 16 14   CREATININE 0.78 0.90 0.71  CALCIUM 8.5* 8.6* 8.6*  PROT  --   --  6.6  ALBUMIN  --   --  3.0*  AST  --   --  36  ALT  --   --  42  ALKPHOS  --   --  51  BILITOT  --   --  1.3*  GFRNONAA >60 >60 >60  ANIONGAP 11 11 9       HematologyNo results for input(s): WBC, RBC, HGB, HCT, MCV, MCH, MCHC, RDW, PLT in the last 168 hours.  BNPNo results for input(s): BNP, PROBNP in the last 168 hours.   DDimer No results for input(s): DDIMER in the last 168 hours.   Radiology    ECHOCARDIOGRAM COMPLETE  Result Date: 06/07/2021    ECHOCARDIOGRAM REPORT   Patient Name:   [name] Date of Exam: 06/07/2021 Medical Rec #:         Height:  75.0 in Accession #:    [id number]       Weight:       248.5 lb Date of Birth:  1961-06-19        BSA:          2.406 m Patient Age:    59 years         BP:           101/55 mmHg Patient Gender: M                HR:           61 bpm. Exam Location:  Inpatient Procedure: 2D Echo, Cardiac Doppler and Color Doppler Indications:    Cardiomyopathy  History:        Patient has prior history of Echocardiogram examinations, most                 recent 12/09/2018. CHF, CAD, COPD, Arrythmias:Atrial                 Fibrillation; Risk Factors:Diabetes and Hypertension. Hep. C.  Sonographer:    Lavenia Atlas Referring Phys: [id number] Kenney Going T Milisa Kimbell IMPRESSIONS  1. Left ventricular ejection fraction, by estimation, is 25 to 30%. The left ventricle has severely decreased function. The left ventricle demonstrates global hypokinesis. The left ventricular internal cavity size was mildly dilated. Left ventricular diastolic parameters are indeterminate.  2. Right ventricular systolic function is normal. The right ventricular size is normal. There is normal pulmonary artery systolic  pressure.  3. Left atrial size was severely dilated.  4. Right atrial size was moderately dilated.  5. The mitral valve is normal in structure. Mild mitral valve regurgitation. No evidence of mitral stenosis.  6. The aortic valve is tricuspid. Aortic valve regurgitation is not visualized. No aortic stenosis is present.  7. Aortic dilatation noted. There is borderline dilatation of the aortic root, measuring 39 mm.  8. The inferior vena cava is dilated in size with >50% respiratory variability, suggesting right atrial pressure of 8 mmHg. FINDINGS  Left Ventricle: Left ventricular ejection fraction, by estimation, is 25 to 30%. The left ventricle has severely decreased function. The left ventricle demonstrates global hypokinesis. The left ventricular internal cavity size was mildly dilated. There is no left ventricular hypertrophy. Left ventricular diastolic parameters are indeterminate. Right Ventricle: The right ventricular size is normal. Right ventricular systolic function is normal. There is normal pulmonary artery systolic pressure. The tricuspid regurgitant velocity is 2.55 m/s, and with an assumed right atrial pressure of 8 mmHg,  the estimated right ventricular systolic pressure is 34.0 mmHg. Left Atrium: Left atrial size was severely dilated. Right Atrium: Right atrial size was moderately dilated. Prominent Chiari network. Pericardium: There is no evidence of pericardial effusion. Mitral Valve: The mitral valve is normal in structure. Mild mitral valve regurgitation. No evidence of mitral valve stenosis. Tricuspid Valve: The tricuspid valve is normal in structure. Tricuspid valve regurgitation is mild . No evidence of tricuspid stenosis. Aortic Valve: The aortic valve is tricuspid. Aortic valve regurgitation is not visualized. No aortic stenosis is present. Pulmonic Valve: The pulmonic valve was normal in structure. Pulmonic valve regurgitation is not visualized. No evidence of pulmonic stenosis. Aorta:  Aortic dilatation noted. There is borderline dilatation of the aortic root, measuring 39 mm. Venous: The inferior vena cava is dilated in size with greater than 50% respiratory variability, suggesting right atrial pressure of 8 mmHg. IAS/Shunts: No atrial level shunt detected by color flow Doppler.  LEFT  VENTRICLE PLAX 2D LVIDd:         5.60 cm  Diastology LVIDs:         4.60 cm  LV e' medial:    6.31 cm/s LV PW:         1.30 cm  LV E/e' medial:  14.2 LV IVS:        1.10 cm  LV e' lateral:   15.20 cm/s LVOT diam:     2.20 cm  LV E/e' lateral: 5.9 LV SV:         60 LV SV Index:   25 LVOT Area:     3.80 cm  RIGHT VENTRICLE RV Basal diam:  3.20 cm RV S prime:     5.33 cm/s TAPSE (M-mode): 1.2 cm LEFT ATRIUM              Index       RIGHT ATRIUM           Index LA diam:        5.60 cm  2.33 cm/m  RA Area:     30.50 cm LA Vol (A2C):   147.0 ml 61.09 ml/m RA Volume:   115.00 ml 47.79 ml/m LA Vol (A4C):   94.0 ml  39.06 ml/m LA Biplane Vol: 121.0 ml 50.28 ml/m  AORTIC VALVE LVOT Vmax:   86.20 cm/s LVOT Vmean:  52.700 cm/s LVOT VTI:    0.157 m  AORTA Ao Root diam: 3.40 cm MITRAL VALVE               TRICUSPID VALVE MV Area (PHT): 3.85 cm    TR Peak grad:   26.0 mmHg MV Decel Time: 197 msec    TR Vmax:        255.00 cm/s MV E velocity: 89.50 cm/s MV A velocity: 26.10 cm/s  SHUNTS MV E/A ratio:  3.43        Systemic VTI:  0.16 m                            Systemic Diam: 2.20 cm Olga Millers MD Electronically signed by Olga Millers MD Signature Date/Time: 06/07/2021/3:04:59 PM    Final     Cardiac Studies   03/14/2021 Echo (care everywhere) Summary    1. Technically difficult study..    2. Echo contrast utilized to enhance endocardial border definition.    3. The left ventricle is mildly dilated in size with normal wall thickness.    4. The left ventricular systolic function is severely decreased, LVEF is  visually estimated at 25-30%.    5. The right ventricle is not well visualized but probably normal in  size,  with low normal systolic function.    6. There is mild mitral valve regurgitation.    7. There is mild tricuspid regurgitation.    8. The left atrium is moderately dilated in size.    9. Estimated pulmonary artery systolic pressure is + RA pressure (IVC  not well visualized)..        Assessment & Plan    Mr Sittner is a 60yo man with history of CAD, DM, HCV treated, HTN, chronic combined systolic and diastolic HF and AF with rapid ventricular rates who initially presented 06/06/2021 for dofetilide loading. After the first dose of dofetilide, the Qtc was significantly prolonged. Given his multiple comorbidities, we have changed him to amiodarone IV for loading.  #persistent AF On xarelto for stroke ppx.  Uncontrolled ventricular rates likely contributing to his worsened cardiomyopathy. Will repeat echo now given most recent not in our system. Continue IV amiodarone  Baseline cmp, tsh, free t4 OK If still in AF by tomorrow, plan for DCCV Thursday. Repeat ECG ordered  #chronic combined systolic and diastolic HF EF last around 30% at [facility] Repeat TTE this admission to establish baseline Continue torsemide Continue metoprolol Continue entresto  #CAD No anginal symptoms  Plan discussed with patient who is in agreement.  For questions or updates, please contact [facility] Please consult [email or web address] for contact info under        Signed, Lanier Prude, MD  06/08/2021, 6:12 AM

## 2021-06-08 NOTE — Progress Notes (Signed)
Inpatient Diabetes Program Recommendations  AACE/ADA: New Consensus Statement on Inpatient Glycemic Control (2015)  Target Ranges:  Prepandial:   less than 140 mg/dL      Peak postprandial:   less than 180 mg/dL (1-2 hours)      Critically ill patients:  140 - 180 mg/dL   Lab Results  Component Value Date   GLUCAP 169 (H) 12/10/2018   HGBA1C 9.1 (H) 06/07/2021    Review of Glycemic Control Results for EMERSEN, CARROLL (MRN [id number]) as of 06/08/2021 10:35  Ref. Range 06/06/2021 12:30 06/07/2021 01:52 06/08/2021 03:15  Glucose Latest Ref Range: 70 - 99 mg/dL 644 (H) 034 (H) 742 (H)   Diabetes history: DM2 Outpatient Diabetes medications: Trulicity 1.5 mg weekly, Glipizide 10 mg daily Current orders for Inpatient glycemic control: Glipizide 10 mg daily  Inpatient Diabetes Program Recommendations:    NPO after MN.  Please discontinue glipizide 10 mg and consider glycemic control order set with cbg's ac/hs using Novolog 0-9 units TID and 0-5 units QHS.  Will continue to follow while inpatient.  Thank you, Dulce Sellar, RN, BSN Diabetes Coordinator Inpatient Diabetes Program [phone] (team pager from 8a-5p)

## 2021-06-08 NOTE — Progress Notes (Signed)
Inpatient Diabetes Program Recommendations  AACE/ADA: New Consensus Statement on Inpatient Glycemic Control (2015)  Target Ranges:  Prepandial:   less than 140 mg/dL      Peak postprandial:   less than 180 mg/dL (1-2 hours)      Critically ill patients:  140 - 180 mg/dL   Lab Results  Component Value Date   GLUCAP 169 (H) 12/10/2018   HGBA1C 9.1 (H) 06/07/2021    Review of Glycemic Control  Diabetes history: DM2 Outpatient Diabetes medications: Trulicity 1.5 mg weekly and glipizide 10 mg QD  Spoke with patient at bedside.  Reviewed patient's current A1c of 9.1% (average blood sugar of 215 mg/dL). Explained what a A1c is and what it measures. Also reviewed goal A1c with patient, importance of good glucose control @ home, and blood sugar goals.  His A1C is decreased from 13.2% on 03/13/21.  He states he has been taking his medications as prescribed and watching his diet.  Congratulated him on his progress.  Encouraged him to keep up the good work and aim for an A1C of 7%.  He states he can do and has had it below 7% in the past.  He denies difficulties obtaining medications.  He avoids beverages with sugar and limits his CHO intake.  Will continue to follow while inpatient.  Thank you, Dulce Sellar, RN, BSN Diabetes Coordinator Inpatient Diabetes Program [phone] (team pager from 8a-5p)

## 2021-06-09 ENCOUNTER — Encounter (HOSPITAL_COMMUNITY): Payer: Self-pay | Admitting: Cardiology

## 2021-06-09 ENCOUNTER — Inpatient Hospital Stay (HOSPITAL_COMMUNITY): Payer: BC Managed Care – PPO | Admitting: Certified Registered"

## 2021-06-09 ENCOUNTER — Encounter (HOSPITAL_COMMUNITY)
Admission: RE | Disposition: A | Discharge status: Home / Self Care | Payer: Self-pay | Source: Ambulatory Visit | Attending: Cardiology

## 2021-06-09 ENCOUNTER — Other Ambulatory Visit (HOSPITAL_COMMUNITY): Payer: Self-pay

## 2021-06-09 HISTORY — PX: CARDIOVERSION: SHX1299

## 2021-06-09 LAB — BASIC METABOLIC PANEL
Anion gap: 6 (ref 5–15)
BUN: 16 mg/dL (ref 6–20)
CO2: 29 mmol/L (ref 22–32)
Calcium: 8.5 mg/dL — ABNORMAL LOW (ref 8.9–10.3)
Chloride: 102 mmol/L (ref 98–111)
Creatinine, Ser: 0.78 mg/dL (ref 0.61–1.24)
GFR, Estimated: >60 mL/min (ref >60)
Glucose, Bld: 116 mg/dL — ABNORMAL HIGH (ref 70–99)
Potassium: 4.2 mmol/L (ref 3.5–5.1)
Sodium: 137 mmol/L (ref 135–145)

## 2021-06-09 LAB — MAGNESIUM: Magnesium: 2.1 mg/dL (ref 1.7–2.4)

## 2021-06-09 SURGERY — CARDIOVERSION
Anesthesia: General

## 2021-06-09 MED ORDER — LIDOCAINE HCL (PF) 2 % IJ SOLN
INTRAMUSCULAR | Status: DC | PRN
  Administered 2021-06-09: 25 mg via INTRADERMAL

## 2021-06-09 MED ORDER — AMIODARONE HCL 200 MG PO TABS
200.0000 mg | ORAL_TABLET | Freq: Every day | ORAL | 5 refills | Status: DC
  Filled 2021-06-09: qty 30, 30d supply, fill #0

## 2021-06-09 MED ORDER — AMIODARONE HCL 200 MG PO TABS
200.0000 mg | ORAL_TABLET | Freq: Every day | ORAL | Status: DC
  Administered 2021-06-09: 200 mg via ORAL
  Filled 2021-06-09: qty 1

## 2021-06-09 MED ORDER — PROPOFOL 10 MG/ML IV BOLUS
INTRAVENOUS | Status: DC | PRN
  Administered 2021-06-09: 70 mg via INTRAVENOUS
  Administered 2021-06-09: 30 mg via INTRAVENOUS

## 2021-06-09 MED ORDER — LIDOCAINE HCL (PF) 2 % IJ SOLN: INTRAMUSCULAR | Status: DC | PRN

## 2021-06-09 MED ORDER — SACUBITRIL-VALSARTAN 24-26 MG PO TABS
1.0000 | ORAL_TABLET | Freq: Two times a day (BID) | ORAL | 5 refills | Status: DC
  Filled 2021-06-09: qty 60, 30d supply, fill #0

## 2021-06-09 NOTE — Progress Notes (Signed)
   06/09/21 0945  Assess: MEWS Score  Temp 98 F (36.7 C)  BP 110/69  Pulse Rate 60  Resp 18  Level of Consciousness Alert  SpO2 100 %  O2 Device Room Air  Assess: MEWS Score  MEWS Temp 0  MEWS Systolic 0  MEWS Pulse 0  MEWS RR 0  MEWS LOC 0  MEWS Score 0  MEWS Score Color Green  Assess: if the MEWS score is Yellow or Red  Were vital signs taken at a resting state? Yes  Focused Assessment No change from prior assessment  Early Detection of Sepsis Score *See Row Information* Low  MEWS guidelines implemented *See Row Information* No, vital signs rechecked

## 2021-06-09 NOTE — H&P (View-Only) (Signed)
EP Progress Note  Patient Name: [name] Date of Encounter: 06/09/2021  [facility] Cardiologist: Lanier Prude   Subjective   NAEO. Continues to tolerate amiodarone and entresto (new additions this admission) Remains in AF. Plan for DCCV today then likely discharge.  Inpatient Medications    Scheduled Meds:  aspirin EC  81 mg Oral Daily   atorvastatin  80 mg Oral QHS   glipiZIDE  10 mg Oral Daily   metoprolol succinate  100 mg Oral Daily   rivaroxaban  20 mg Oral Q supper   sacubitril-valsartan  1 tablet Oral BID   sodium chloride flush  3 mL Intravenous Q12H   torsemide  40 mg Oral Daily   Continuous Infusions:  sodium chloride     amiodarone 30 mg/hr (06/09/21 0455)   PRN Meds: sodium chloride, sodium chloride flush   Vital Signs    Vitals:   06/08/21 1700 06/08/21 2022 06/09/21 0042 06/09/21 0634  BP:  97/71 104/88   Pulse:  88 86   Resp: 15 13 15    Temp:  97.9 F (36.6 C) 98 F (36.7 C)   TempSrc:  Oral Oral   SpO2:  96%    Weight:    110.2 kg  Height:        Intake/Output Summary (Last 24 hours) at 06/09/2021 0650 Last data filed at 06/09/2021 0419 Gross per 24 hour  Intake 1259.84 ml  Output --  Net 1259.84 ml    Last 3 Weights 06/09/2021 06/08/2021 06/06/2021  Weight (lbs) 242 lb 15.2 oz 244 lb 14.9 oz 248 lb 7.3 oz  Weight (kg) 110.2 kg 111.1 kg 112.7 kg      Telemetry    AF - Personally Reviewed  ECG    Personally Reviewed  Physical Exam   GEN: No acute distress.  Sitting on side of bed. Neck: No JVD Cardiac: irregular rhythm, no murmurs, rubs, or gallops.  Respiratory: Clear to auscultation bilaterally. GI: Soft, nontender, non-distended  MS: Trace edema; No deformity. Feet wrapped. Neuro:  Nonfocal  Psych: Normal affect   Labs    High Sensitivity Troponin:  No results for input(s): TROPONINIHS in the last 720 hours.    Chemistry Recent Labs  Lab 06/07/21 0152 06/08/21 0315 06/09/21 0203  NA 137 137 137   K 4.1 3.7 4.2  CL 99 100 102  CO2 27 28 29   GLUCOSE 225* 156* 116*  BUN 16 14 16   CREATININE 0.90 0.71 0.78  CALCIUM 8.6* 8.6* 8.5*  PROT  --  6.6  --   ALBUMIN  --  3.0*  --   AST  --  36  --   ALT  --  42  --   ALKPHOS  --  51  --   BILITOT  --  1.3*  --   GFRNONAA >60 >60 >60  ANIONGAP 11 9 6       HematologyNo results for input(s): WBC, RBC, HGB, HCT, MCV, MCH, MCHC, RDW, PLT in the last 168 hours.  BNPNo results for input(s): BNP, PROBNP in the last 168 hours.   DDimer No results for input(s): DDIMER in the last 168 hours.   Radiology    ECHOCARDIOGRAM COMPLETE  Result Date: 06/07/2021    ECHOCARDIOGRAM REPORT   Patient Name:   [name] Date of Exam: 06/07/2021 Medical Rec #:         Height:       75.0 in Accession #:  [id number]       Weight:       248.5 lb Date of Birth:  1961/11/06        BSA:          2.406 m Patient Age:    59 years         BP:           101/55 mmHg Patient Gender: M                HR:           61 bpm. Exam Location:  Inpatient Procedure: 2D Echo, Cardiac Doppler and Color Doppler Indications:    Cardiomyopathy  History:        Patient has prior history of Echocardiogram examinations, most                 recent 12/09/2018. CHF, CAD, COPD, Arrythmias:Atrial                 Fibrillation; Risk Factors:Diabetes and Hypertension. Hep. C.  Sonographer:    Lavenia Atlas Referring Phys: [id number] Lovada Barwick T Nyzier Boivin IMPRESSIONS  1. Left ventricular ejection fraction, by estimation, is 25 to 30%. The left ventricle has severely decreased function. The left ventricle demonstrates global hypokinesis. The left ventricular internal cavity size was mildly dilated. Left ventricular diastolic parameters are indeterminate.  2. Right ventricular systolic function is normal. The right ventricular size is normal. There is normal pulmonary artery systolic pressure.  3. Left atrial size was severely dilated.  4. Right atrial size was moderately dilated.  5. The  mitral valve is normal in structure. Mild mitral valve regurgitation. No evidence of mitral stenosis.  6. The aortic valve is tricuspid. Aortic valve regurgitation is not visualized. No aortic stenosis is present.  7. Aortic dilatation noted. There is borderline dilatation of the aortic root, measuring 39 mm.  8. The inferior vena cava is dilated in size with >50% respiratory variability, suggesting right atrial pressure of 8 mmHg. FINDINGS  Left Ventricle: Left ventricular ejection fraction, by estimation, is 25 to 30%. The left ventricle has severely decreased function. The left ventricle demonstrates global hypokinesis. The left ventricular internal cavity size was mildly dilated. There is no left ventricular hypertrophy. Left ventricular diastolic parameters are indeterminate. Right Ventricle: The right ventricular size is normal. Right ventricular systolic function is normal. There is normal pulmonary artery systolic pressure. The tricuspid regurgitant velocity is 2.55 m/s, and with an assumed right atrial pressure of 8 mmHg,  the estimated right ventricular systolic pressure is 34.0 mmHg. Left Atrium: Left atrial size was severely dilated. Right Atrium: Right atrial size was moderately dilated. Prominent Chiari network. Pericardium: There is no evidence of pericardial effusion. Mitral Valve: The mitral valve is normal in structure. Mild mitral valve regurgitation. No evidence of mitral valve stenosis. Tricuspid Valve: The tricuspid valve is normal in structure. Tricuspid valve regurgitation is mild . No evidence of tricuspid stenosis. Aortic Valve: The aortic valve is tricuspid. Aortic valve regurgitation is not visualized. No aortic stenosis is present. Pulmonic Valve: The pulmonic valve was normal in structure. Pulmonic valve regurgitation is not visualized. No evidence of pulmonic stenosis. Aorta: Aortic dilatation noted. There is borderline dilatation of the aortic root, measuring 39 mm. Venous: The  inferior vena cava is dilated in size with greater than 50% respiratory variability, suggesting right atrial pressure of 8 mmHg. IAS/Shunts: No atrial level shunt detected by color flow Doppler.  LEFT VENTRICLE PLAX 2D LVIDd:  5.60 cm  Diastology LVIDs:         4.60 cm  LV e' medial:    6.31 cm/s LV PW:         1.30 cm  LV E/e' medial:  14.2 LV IVS:        1.10 cm  LV e' lateral:   15.20 cm/s LVOT diam:     2.20 cm  LV E/e' lateral: 5.9 LV SV:         60 LV SV Index:   25 LVOT Area:     3.80 cm  RIGHT VENTRICLE RV Basal diam:  3.20 cm RV S prime:     5.33 cm/s TAPSE (M-mode): 1.2 cm LEFT ATRIUM              Index       RIGHT ATRIUM           Index LA diam:        5.60 cm  2.33 cm/m  RA Area:     30.50 cm LA Vol (A2C):   147.0 ml 61.09 ml/m RA Volume:   115.00 ml 47.79 ml/m LA Vol (A4C):   94.0 ml  39.06 ml/m LA Biplane Vol: 121.0 ml 50.28 ml/m  AORTIC VALVE LVOT Vmax:   86.20 cm/s LVOT Vmean:  52.700 cm/s LVOT VTI:    0.157 m  AORTA Ao Root diam: 3.40 cm MITRAL VALVE               TRICUSPID VALVE MV Area (PHT): 3.85 cm    TR Peak grad:   26.0 mmHg MV Decel Time: 197 msec    TR Vmax:        255.00 cm/s MV E velocity: 89.50 cm/s MV A velocity: 26.10 cm/s  SHUNTS MV E/A ratio:  3.43        Systemic VTI:  0.16 m                            Systemic Diam: 2.20 cm Olga Millers MD Electronically signed by Olga Millers MD Signature Date/Time: 06/07/2021/3:04:59 PM    Final     Cardiac Studies   03/14/2021 Echo (care everywhere) Summary    1. Technically difficult study..    2. Echo contrast utilized to enhance endocardial border definition.    3. The left ventricle is mildly dilated in size with normal wall thickness.    4. The left ventricular systolic function is severely decreased, LVEF is  visually estimated at 25-30%.    5. The right ventricle is not well visualized but probably normal in size,  with low normal systolic function.    6. There is mild mitral valve regurgitation.    7. There  is mild tricuspid regurgitation.    8. The left atrium is moderately dilated in size.    9. Estimated pulmonary artery systolic pressure is + RA pressure (IVC  not well visualized)..   06/07/2021 Echo EF 25% Global hypokinesis RV function normal Severely dilated LA Moderately dilated RA Mild MR     Assessment & Plan    Mr Abarca is a 60yo man with history of CAD, DM, HCV treated, HTN, chronic combined systolic and diastolic HF and AF with rapid ventricular rates who initially presented 06/06/2021 for dofetilide loading. After the first dose of dofetilide, the Qtc was significantly prolonged. Given his multiple comorbidities, we have changed him to amiodarone IV for loading.  #persistent AF On  xarelto for stroke ppx. Uncontrolled ventricular rates likely contributing to his worsened cardiomyopathy. 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 NYHA class II. Warm and near euvolemic. ICD discussions will be needed once wounds are healed and patient on stable medications for at least 3 months. Continue torsemide Continue metoprolol Continue entresto  #CAD No anginal symptoms  Likely discharge after DCCV.  For questions or updates, please contact [facility] Please consult [email or web address] for contact info under        Signed, Von Inscoe T Engelbert Sevin, MD  06/09/2021, 6:50 AM    

## 2021-06-09 NOTE — Discharge Summary (Signed)
ELECTROPHYSIOLOGY PROCEDURE DISCHARGE SUMMARY    Patient ID: [name],  MRN: [id number], DOB/AGE: 61-Jul-1962 61 y.o.  Admit date: 06/06/2021 Discharge date: 06/09/2021  Primary Care Physician: Patient, No Pcp Per (Inactive)  Primary Cardiologist: Dr. Cassie Freer Electrophysiologist: Dr. Lalla Brothers  Primary Discharge Diagnosis:  1.  presistent atrial fibrillation status post Tikosyn loading this admission      CHA2DS2Vasc is 4, on Xarelto  2. B/l foot wounds He is etablished and following at wound care cente at [facility]  Secondary Discharge Diagnosis:  CAD DM HTN Treated Hep C Chronic CHF (combined)  Allergies  Allergen Reactions   Pseudoephedrine     starts him in A fib     Procedures This Admission:  1.  Tikosyn loading 2. Amiodarone load  Brief HPI: [name] is a 60 y.o. male with a past medical history as noted above.  They were referred to EP in the outpatient setting for treatment options of atrial fibrillation.  Risks, benefits, and alternatives to Tikosyn were reviewed with the patient who wished to proceed.    Hospital Course:  The patient was admitted and Tikosyn was initiated.  The patient's QTc became markedly prolonged after one dose and treatment strategy changed to amiodarone.  He was started on amiodarone gtt to load as well as entresto.  Labs, Creat and lytes remained stable.  Baseline LFTs and thyroid done.  TTE done her noted known LVEF 25-30%.  On 06/09/21 the patient underwent direct current cardioversion which restored sinus rhythm.  H was monitored until discharge on telemetry which demonstrated AFib w/CVR and post DCCV SR.  On the day of discharge, he feels well, was examined by Dr Lalla Brothers who considered the patient stable for discharge to home.  Follow up will be arranged  He has follow up with his wound care clinic for his foot wounds and supplies at home   Starting Amiodarone 200mg  day Entresto 24/26 BID Stopping   Diltiazem  Follow up with theAFib clnic in 2 weeks, plan for BMET at that visit given Entresto and EP APP in 6-8 weeks for medication titration.   Physical Exam: Vitals:   06/09/21 0840 06/09/21 0850 06/09/21 0900 06/09/21 0945  BP: 96/67 106/69 (!) 95/51 110/69  Pulse: 66 (!) 59 (!) 56 60  Resp: 16 13 (!) 8 18  Temp: 97.8 F (36.6 C)   98 F (36.7 C)  TempSrc: Oral     SpO2: 100% 100% 100% 100%  Weight:      Height:         GEN- The patient is well appearing, alert and oriented x 3 today.   HEENT: normocephalic, atraumatic; sclera clear, conjunctiva pink; hearing intact; oropharynx clear; neck supple, no JVP Lymph- no cervical lymphadenopathy Lungs- CTA b/l, normal work of breathing.  No wheezes, rales, rhonchi Heart-RRR, no murmurs, rubs or gallops, PMI not laterally displaced GI- soft, non-tender, non-distended Extremities- no clubbing, cyanosis, or edema MS- no significant deformity or atrophy Skin- warm and dry, no rash or lesion Psych- euthymic mood, full affect Neuro- strength and sensation are intact   Labs:   Lab Results  Component Value Date   WBC 8.5 12/08/2018   HGB 14.5 12/08/2018   HCT 45.0 12/08/2018   MCV 91.3 12/08/2018   PLT 114 (L) 12/08/2018    Recent Labs  Lab 06/08/21 0315 06/09/21 0203  NA 137 137  K 3.7 4.2  CL 100 102  CO2 28 29  BUN 14 16  CREATININE 0.71 0.78  CALCIUM 8.6* 8.5*  PROT 6.6  --   BILITOT 1.3*  --   ALKPHOS 51  --   ALT 42  --   AST 36  --   GLUCOSE 156* 116*     Discharge Medications:  Allergies as of 06/09/2021       Reactions   Pseudoephedrine    starts him in A fib        Medication List     STOP taking these medications    diltiazem 180 MG 24 hr capsule Commonly known as: CARDIZEM CD       TAKE these medications    amiodarone 200 MG tablet Commonly known as: PACERONE Take 1 tablet (200 mg total) by mouth daily. Start taking on: June 10, 2021   aspirin 81 MG EC tablet Take 81 mg  by mouth daily.   atorvastatin 80 MG tablet Commonly known as: LIPITOR Take 80 mg by mouth daily.   glipiZIDE 10 MG 24 hr tablet Commonly known as: GLUCOTROL XL Take 1 tablet by mouth daily.   metoprolol succinate 100 MG 24 hr tablet Commonly known as: TOPROL-XL Take 1 tablet (100 mg total) by mouth daily. Take with or immediately following a meal.   rivaroxaban 20 MG Tabs tablet Commonly known as: XARELTO Take 20 mg by mouth daily in the afternoon.   sacubitril-valsartan 24-26 MG Commonly known as: ENTRESTO Take 1 tablet by mouth 2 (two) times daily.   torsemide 20 MG tablet Commonly known as: DEMADEX Take 40 mg by mouth daily.   Trulicity 1.5 MG/0.5ML Sopn Generic drug: Dulaglutide INJECT 0.5 MLS (1.5 MG TOTAL) SUBCUTANEOUSLY EVERY 7 (SEVEN) DAYS FOR 30 DAYS               Discharge Care Instructions  (From admission, onward)           Start     Ordered   06/09/21 0000  Discharge wound care:       Comments: Continue to follow up with your wound care clinic as you are instructed by them   06/09/21 1241            Disposition:  Discharge Instructions     Diet - low sodium heart healthy   Complete by: As directed    Discharge wound care:   Complete by: As directed    Continue to follow up with your wound care clinic as you are instructed by them   Increase activity slowly   Complete by: As directed        Follow-up Information     Graciella Freer, PA-C Follow up.   Specialty: Physician Assistant Why: 07/21/21 @ 9;40AM, for Dr. Sheliah Plane information: [address] [place] [address] [phone]         [facility] .   Specialty: Cardiology Why: 77/22 @ 2:00PM with Everlena Cooper, NP Contact information: [address] [id number] Wilhemina Bonito [place] [address] [phone]                Duration of Discharge Encounter: Greater than 30 minutes including physician  time.  Norma Fredrickson, PA-C 06/09/2021 12:48 PM

## 2021-06-09 NOTE — Progress Notes (Signed)
Pt is alert and oriented. Discharge instructions/ AVS given to pt. Pt instructed to get the the medications from  [facility] pharmacy on their way to exit.

## 2021-06-09 NOTE — Care Management (Signed)
06-09-21 1235 Patient to get first fill of Entresto via [facility]. Patient was provided the [facility] co pay card. No further needs at this time.

## 2021-06-09 NOTE — Anesthesia Preprocedure Evaluation (Signed)
Anesthesia Evaluation  Patient identified by MRN, date of birth, ID band Patient awake    Reviewed: Allergy & Precautions, H&P , NPO status , Patient's Chart, lab work & pertinent test results  History of Anesthesia Complications Negative for: history of anesthetic complications  Airway Mallampati: II  TM Distance: >3 FB Neck ROM: full    Dental   Pulmonary Current Smoker,    Pulmonary exam normal        Cardiovascular hypertension, + CAD, + Cardiac Stents and +CHF  + dysrhythmias Atrial Fibrillation  Rhythm:irregular     Neuro/Psych negative neurological ROS  negative psych ROS   GI/Hepatic negative GI ROS, (+) Hepatitis -, C  Endo/Other  negative endocrine ROSdiabetes  Renal/GU negative Renal ROS  negative genitourinary   Musculoskeletal   Abdominal   Peds  Hematology   Anesthesia Other Findings   Reproductive/Obstetrics                             Anesthesia Physical Anesthesia Plan  ASA: 4  Anesthesia Plan: General   Post-op Pain Management:    Induction:   PONV Risk Score and Plan:   Airway Management Planned:   Additional Equipment:   Intra-op Plan:   Post-operative Plan:   Informed Consent: I have reviewed the patients History and Physical, chart, labs and discussed the procedure including the risks, benefits and alternatives for the proposed anesthesia with the patient or authorized representative who has indicated his/her understanding and acceptance.       Plan Discussed with: Anesthesiologist  Anesthesia Plan Comments:         Anesthesia Quick Evaluation

## 2021-06-09 NOTE — Anesthesia Procedure Notes (Signed)
Procedure Name: MAC Date/Time: 06/09/2021 8:30 AM Performed by: Griffin Dakin, CRNA Pre-anesthesia Checklist: Patient identified, Emergency Drugs available, Suction available, Patient being monitored and Timeout performed Patient Re-evaluated:Patient Re-evaluated prior to induction Oxygen Delivery Method: Ambu bag Induction Type: IV induction Placement Confirmation: positive ETCO2 and breath sounds checked- equal and bilateral Dental Injury: Teeth and Oropharynx as per pre-operative assessment

## 2021-06-09 NOTE — Interval H&P Note (Signed)
History and Physical Interval Note:  06/09/2021 7:17 AM  [name]  has presented today for surgery, with the diagnosis of afib.  The various methods of treatment have been discussed with the patient and family. After consideration of risks, benefits and other options for treatment, the patient has consented to  Procedure(s): CARDIOVERSION (N/A) as a surgical intervention.  The patient's history has been reviewed, patient examined, no change in status, stable for surgery.  I have reviewed the patient's chart and labs.  Questions were answered to the patient's satisfaction.     Charlton Haws

## 2021-06-09 NOTE — Progress Notes (Signed)
EP Progress Note  Patient Name: [name] Date of Encounter: 06/09/2021  [facility] Cardiologist: Lanier Prude   Subjective   NAEO. Continues to tolerate amiodarone and entresto (new additions this admission) Remains in AF. Plan for DCCV today then likely discharge.  Inpatient Medications    Scheduled Meds:  aspirin EC  81 mg Oral Daily   atorvastatin  80 mg Oral QHS   glipiZIDE  10 mg Oral Daily   metoprolol succinate  100 mg Oral Daily   rivaroxaban  20 mg Oral Q supper   sacubitril-valsartan  1 tablet Oral BID   sodium chloride flush  3 mL Intravenous Q12H   torsemide  40 mg Oral Daily   Continuous Infusions:  sodium chloride     amiodarone 30 mg/hr (06/09/21 0455)   PRN Meds: sodium chloride, sodium chloride flush   Vital Signs    Vitals:   06/08/21 1700 06/08/21 2022 06/09/21 0042 06/09/21 0634  BP:  97/71 104/88   Pulse:  88 86   Resp: 15 13 15    Temp:  97.9 F (36.6 C) 98 F (36.7 C)   TempSrc:  Oral Oral   SpO2:  96%    Weight:    110.2 kg  Height:        Intake/Output Summary (Last 24 hours) at 06/09/2021 0650 Last data filed at 06/09/2021 0419 Gross per 24 hour  Intake 1259.84 ml  Output --  Net 1259.84 ml    Last 3 Weights 06/09/2021 06/08/2021 06/06/2021  Weight (lbs) 242 lb 15.2 oz 244 lb 14.9 oz 248 lb 7.3 oz  Weight (kg) 110.2 kg 111.1 kg 112.7 kg      Telemetry    AF - Personally Reviewed  ECG    Personally Reviewed  Physical Exam   GEN: No acute distress.  Sitting on side of bed. Neck: No JVD Cardiac: irregular rhythm, no murmurs, rubs, or gallops.  Respiratory: Clear to auscultation bilaterally. GI: Soft, nontender, non-distended  MS: Trace edema; No deformity. Feet wrapped. Neuro:  Nonfocal  Psych: Normal affect   Labs    High Sensitivity Troponin:  No results for input(s): TROPONINIHS in the last 720 hours.    Chemistry Recent Labs  Lab 06/07/21 0152 06/08/21 0315 06/09/21 0203  NA 137 137 137   K 4.1 3.7 4.2  CL 99 100 102  CO2 27 28 29   GLUCOSE 225* 156* 116*  BUN 16 14 16   CREATININE 0.90 0.71 0.78  CALCIUM 8.6* 8.6* 8.5*  PROT  --  6.6  --   ALBUMIN  --  3.0*  --   AST  --  36  --   ALT  --  42  --   ALKPHOS  --  51  --   BILITOT  --  1.3*  --   GFRNONAA >60 >60 >60  ANIONGAP 11 9 6       HematologyNo results for input(s): WBC, RBC, HGB, HCT, MCV, MCH, MCHC, RDW, PLT in the last 168 hours.  BNPNo results for input(s): BNP, PROBNP in the last 168 hours.   DDimer No results for input(s): DDIMER in the last 168 hours.   Radiology    ECHOCARDIOGRAM COMPLETE  Result Date: 06/07/2021    ECHOCARDIOGRAM REPORT   Patient Name:   [name] Date of Exam: 06/07/2021 Medical Rec #:         Height:       75.0 in Accession #:  [id number]       Weight:       248.5 lb Date of Birth:  1961/11/06        BSA:          2.406 m Patient Age:    59 years         BP:           101/55 mmHg Patient Gender: M                HR:           61 bpm. Exam Location:  Inpatient Procedure: 2D Echo, Cardiac Doppler and Color Doppler Indications:    Cardiomyopathy  History:        Patient has prior history of Echocardiogram examinations, most                 recent 12/09/2018. CHF, CAD, COPD, Arrythmias:Atrial                 Fibrillation; Risk Factors:Diabetes and Hypertension. Hep. C.  Sonographer:    Lavenia Atlas Referring Phys: [id number] Laneta Guerin T Patriece Archbold IMPRESSIONS  1. Left ventricular ejection fraction, by estimation, is 25 to 30%. The left ventricle has severely decreased function. The left ventricle demonstrates global hypokinesis. The left ventricular internal cavity size was mildly dilated. Left ventricular diastolic parameters are indeterminate.  2. Right ventricular systolic function is normal. The right ventricular size is normal. There is normal pulmonary artery systolic pressure.  3. Left atrial size was severely dilated.  4. Right atrial size was moderately dilated.  5. The  mitral valve is normal in structure. Mild mitral valve regurgitation. No evidence of mitral stenosis.  6. The aortic valve is tricuspid. Aortic valve regurgitation is not visualized. No aortic stenosis is present.  7. Aortic dilatation noted. There is borderline dilatation of the aortic root, measuring 39 mm.  8. The inferior vena cava is dilated in size with >50% respiratory variability, suggesting right atrial pressure of 8 mmHg. FINDINGS  Left Ventricle: Left ventricular ejection fraction, by estimation, is 25 to 30%. The left ventricle has severely decreased function. The left ventricle demonstrates global hypokinesis. The left ventricular internal cavity size was mildly dilated. There is no left ventricular hypertrophy. Left ventricular diastolic parameters are indeterminate. Right Ventricle: The right ventricular size is normal. Right ventricular systolic function is normal. There is normal pulmonary artery systolic pressure. The tricuspid regurgitant velocity is 2.55 m/s, and with an assumed right atrial pressure of 8 mmHg,  the estimated right ventricular systolic pressure is 34.0 mmHg. Left Atrium: Left atrial size was severely dilated. Right Atrium: Right atrial size was moderately dilated. Prominent Chiari network. Pericardium: There is no evidence of pericardial effusion. Mitral Valve: The mitral valve is normal in structure. Mild mitral valve regurgitation. No evidence of mitral valve stenosis. Tricuspid Valve: The tricuspid valve is normal in structure. Tricuspid valve regurgitation is mild . No evidence of tricuspid stenosis. Aortic Valve: The aortic valve is tricuspid. Aortic valve regurgitation is not visualized. No aortic stenosis is present. Pulmonic Valve: The pulmonic valve was normal in structure. Pulmonic valve regurgitation is not visualized. No evidence of pulmonic stenosis. Aorta: Aortic dilatation noted. There is borderline dilatation of the aortic root, measuring 39 mm. Venous: The  inferior vena cava is dilated in size with greater than 50% respiratory variability, suggesting right atrial pressure of 8 mmHg. IAS/Shunts: No atrial level shunt detected by color flow Doppler.  LEFT VENTRICLE PLAX 2D LVIDd:  5.60 cm  Diastology LVIDs:         4.60 cm  LV e' medial:    6.31 cm/s LV PW:         1.30 cm  LV E/e' medial:  14.2 LV IVS:        1.10 cm  LV e' lateral:   15.20 cm/s LVOT diam:     2.20 cm  LV E/e' lateral: 5.9 LV SV:         60 LV SV Index:   25 LVOT Area:     3.80 cm  RIGHT VENTRICLE RV Basal diam:  3.20 cm RV S prime:     5.33 cm/s TAPSE (M-mode): 1.2 cm LEFT ATRIUM              Index       RIGHT ATRIUM           Index LA diam:        5.60 cm  2.33 cm/m  RA Area:     30.50 cm LA Vol (A2C):   147.0 ml 61.09 ml/m RA Volume:   115.00 ml 47.79 ml/m LA Vol (A4C):   94.0 ml  39.06 ml/m LA Biplane Vol: 121.0 ml 50.28 ml/m  AORTIC VALVE LVOT Vmax:   86.20 cm/s LVOT Vmean:  52.700 cm/s LVOT VTI:    0.157 m  AORTA Ao Root diam: 3.40 cm MITRAL VALVE               TRICUSPID VALVE MV Area (PHT): 3.85 cm    TR Peak grad:   26.0 mmHg MV Decel Time: 197 msec    TR Vmax:        255.00 cm/s MV E velocity: 89.50 cm/s MV A velocity: 26.10 cm/s  SHUNTS MV E/A ratio:  3.43        Systemic VTI:  0.16 m                            Systemic Diam: 2.20 cm Olga Millers MD Electronically signed by Olga Millers MD Signature Date/Time: 06/07/2021/3:04:59 PM    Final     Cardiac Studies   03/14/2021 Echo (care everywhere) Summary    1. Technically difficult study..    2. Echo contrast utilized to enhance endocardial border definition.    3. The left ventricle is mildly dilated in size with normal wall thickness.    4. The left ventricular systolic function is severely decreased, LVEF is  visually estimated at 25-30%.    5. The right ventricle is not well visualized but probably normal in size,  with low normal systolic function.    6. There is mild mitral valve regurgitation.    7. There  is mild tricuspid regurgitation.    8. The left atrium is moderately dilated in size.    9. Estimated pulmonary artery systolic pressure is + RA pressure (IVC  not well visualized)..   06/07/2021 Echo EF 25% Global hypokinesis RV function normal Severely dilated LA Moderately dilated RA Mild MR     Assessment & Plan    Mr Abarca is a 60yo man with history of CAD, DM, HCV treated, HTN, chronic combined systolic and diastolic HF and AF with rapid ventricular rates who initially presented 06/06/2021 for dofetilide loading. After the first dose of dofetilide, the Qtc was significantly prolonged. Given his multiple comorbidities, we have changed him to amiodarone IV for loading.  #persistent AF On  xarelto for stroke ppx. Uncontrolled ventricular rates likely contributing to his worsened cardiomyopathy. 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 NYHA class II. Warm and near euvolemic. ICD discussions will be needed once wounds are healed and patient on stable medications for at least 3 months. Continue torsemide Continue metoprolol Continue entresto  #CAD No anginal symptoms  Likely discharge after DCCV.  For questions or updates, please contact [facility] Please consult [email or web address] for contact info under        Signed, Lanier Prude, MD  06/09/2021, 6:50 AM

## 2021-06-09 NOTE — CV Procedure (Signed)
DCC: Rx Xarelto Anesthesia: Propofol  New iv inserted as prior was red and infiltrated DCC x 1 150J synch Converted to NSR rate 74 bpm  No immediate neurologic sequelae  Charlton Haws MD [facility]

## 2021-06-09 NOTE — Plan of Care (Signed)
°  Problem: Clinical Measurements: °Goal: Ability to maintain clinical measurements within normal limits will improve °Outcome: Progressing °  °Problem: Clinical Measurements: °Goal: Diagnostic test results will improve °Outcome: Progressing °  °

## 2021-06-09 NOTE — Transfer of Care (Signed)
Immediate Anesthesia Transfer of Care Note  Patient: [name]  Procedure(s) Performed: CARDIOVERSION  Patient Location: Endoscopy Unit  Anesthesia Type:General  Level of Consciousness: awake, alert  and oriented  Airway & Oxygen Therapy: Patient Spontanous Breathing  Post-op Assessment: Report given to RN and Post -op Vital signs reviewed and stable  Post vital signs: Reviewed and stable  Last Vitals:  Vitals Value Taken Time  BP    Temp    Pulse    Resp    SpO2      Last Pain:  Vitals:   06/09/21 0750  TempSrc: Oral  PainSc: 0-No pain      Patients Stated Pain Goal: 0 (06/07/21 0800)  Complications: No notable events documented.

## 2021-06-09 NOTE — Anesthesia Postprocedure Evaluation (Signed)
Anesthesia Post Note  Patient: [name]  Procedure(s) Performed: CARDIOVERSION     Patient location during evaluation: Endoscopy Anesthesia Type: MAC Level of consciousness: awake and alert Pain management: pain level controlled Vital Signs Assessment: post-procedure vital signs reviewed and stable Respiratory status: spontaneous breathing, nonlabored ventilation and respiratory function stable Cardiovascular status: blood pressure returned to baseline and stable Postop Assessment: no apparent nausea or vomiting Anesthetic complications: no   No notable events documented.  Last Vitals:  Vitals:   06/09/21 0850 06/09/21 0900  BP: 106/69 (!) 95/51  Pulse: (!) 59 (!) 56  Resp: 13 (!) 8  Temp:    SpO2: 100% 100%    Last Pain:  Vitals:   06/09/21 0900  TempSrc:   PainSc: 0-No pain                 Lidia Collum

## 2021-06-23 ENCOUNTER — Encounter (HOSPITAL_COMMUNITY): Payer: Self-pay

## 2021-06-23 ENCOUNTER — Ambulatory Visit (HOSPITAL_COMMUNITY): Payer: BC Managed Care – PPO | Admitting: Nurse Practitioner

## 2021-06-29 ENCOUNTER — Encounter (HOSPITAL_COMMUNITY): Payer: Self-pay | Admitting: Nurse Practitioner

## 2021-06-29 ENCOUNTER — Ambulatory Visit (HOSPITAL_COMMUNITY)
Admission: RE | Admit: 2021-06-29 | Discharge: 2021-06-29 | Disposition: A | Discharge status: Home / Self Care | Payer: BC Managed Care – PPO | Source: Ambulatory Visit | Attending: Nurse Practitioner | Admitting: Nurse Practitioner

## 2021-06-29 ENCOUNTER — Other Ambulatory Visit: Payer: Self-pay

## 2021-06-29 VITALS — BP 108/62 | HR 73 | Ht 75.0 in | Wt 251.4 lb

## 2021-06-29 DIAGNOSIS — Z794 Long term (current) use of insulin: Secondary | ICD-10-CM | POA: Diagnosis not present

## 2021-06-29 DIAGNOSIS — I4819 Other persistent atrial fibrillation: Secondary | ICD-10-CM | POA: Diagnosis not present

## 2021-06-29 DIAGNOSIS — E669 Obesity, unspecified: Secondary | ICD-10-CM | POA: Insufficient documentation

## 2021-06-29 DIAGNOSIS — I2510 Atherosclerotic heart disease of native coronary artery without angina pectoris: Secondary | ICD-10-CM | POA: Insufficient documentation

## 2021-06-29 DIAGNOSIS — F17210 Nicotine dependence, cigarettes, uncomplicated: Secondary | ICD-10-CM | POA: Insufficient documentation

## 2021-06-29 DIAGNOSIS — Z7982 Long term (current) use of aspirin: Secondary | ICD-10-CM | POA: Diagnosis not present

## 2021-06-29 DIAGNOSIS — D6869 Other thrombophilia: Secondary | ICD-10-CM

## 2021-06-29 DIAGNOSIS — Z7901 Long term (current) use of anticoagulants: Secondary | ICD-10-CM | POA: Diagnosis not present

## 2021-06-29 DIAGNOSIS — E119 Type 2 diabetes mellitus without complications: Secondary | ICD-10-CM | POA: Insufficient documentation

## 2021-06-29 DIAGNOSIS — Z79899 Other long term (current) drug therapy: Secondary | ICD-10-CM | POA: Insufficient documentation

## 2021-06-29 DIAGNOSIS — I5042 Chronic combined systolic (congestive) and diastolic (congestive) heart failure: Secondary | ICD-10-CM | POA: Insufficient documentation

## 2021-06-29 DIAGNOSIS — I110 Hypertensive heart disease with heart failure: Secondary | ICD-10-CM | POA: Diagnosis not present

## 2021-06-29 DIAGNOSIS — Z6831 Body mass index (BMI) 31.0-31.9, adult: Secondary | ICD-10-CM | POA: Insufficient documentation

## 2021-06-29 NOTE — Progress Notes (Signed)
Primary Care Physician: Patient, No Pcp Per (Inactive) Primary Cardiologist: Dr Darrold Junker Primary Electrophysiologist: Dr Lalla Brothers Referring Physician: Dr Faythe Dingwall is a 60 y.o. male with a history of CAD, DM, hep C treated, HTN, chronic systolic and diastolic CHF, and atrial fibrillation who presents for follow up in the [facility]. Patient was seen remotely by Dr Graciela Husbands and had previously been on dofetilide. He was lost to follow up and was no longer taking several cardiac medications. He was seen by Dr Lalla Brothers 05/11/21 who recommended reloading dofetilide. Patient is on Xarelto for a CHADS2VASC score of 4. He does report about 2 weeks ago he forgot his dose of Xarelto but did take in the next morning. No omitted doses. He does have symptoms of heart racing today.   F/u in afib clinic, 06/29/21 for f/u of the above hospitalization. Unfortunately, he was not able to get on Tikosyn as his qt significantly prolonged.  He was started on amiodarone gtt and transitioned to po. He did undergo DCCV which restored SR but returned to afib with CVR and in the clinic today, he feels improved. EKG shows Sinus rhythm . He remains on 200 mg amiodarone a day.    Today, he denies symptoms of chest pain, shortness of breath, orthopnea, PND, lower extremity edema, dizziness, presyncope, syncope, snoring, daytime somnolence, bleeding, or neurologic sequela. The patient is tolerating medications without difficulties and is otherwise without complaint today.    Atrial Fibrillation Risk Factors:  he does have symptoms or diagnosis of sleep apnea. he does not have a history of rheumatic fever.   he has a BMI of Body mass index is 31.42 kg/m.Marland Kitchen Filed Weights   06/29/21 1347  Weight: 114 kg    Family History  Problem Relation Age of Onset   Other Mother 19       died from complications of gastric bypass surgery   Thyroid disease Mother    Alzheimer's disease  Father    COPD Father    Hypertension Father    Hyperlipidemia Father    Heart disease Father      Atrial Fibrillation Management history:  Previous antiarrhythmic drugs: dofetilide  Previous cardioversions: 2020 Previous ablations: none CHADS2VASC score: 4 Anticoagulation history: Xarelto   Past Medical History:  Diagnosis Date   Atrial fibrillation (HCC)    Atrial fibrillation (HCC)    CHF (congestive heart failure) (HCC)    Coronary artery disease 2016   1 stent   Diabetes mellitus without complication [facility])    ED (erectile dysfunction)    Hepatitis C    Hypertension    Obesity    Past Surgical History:  Procedure Laterality Date   CARDIOVERSION N/A 01/08/2019   Procedure: CARDIOVERSION (CATH LAB);  Surgeon: Marcina Millard, MD;  Location: [facility] ORS;  Service: Cardiovascular;  Laterality: N/A;   CARDIOVERSION N/A 06/09/2021   Procedure: CARDIOVERSION;  Surgeon: Wendall Stade, MD;  Location: [facility] ENDOSCOPY;  Service: Cardiovascular;  Laterality: N/A;   CORONARY STENT PLACEMENT  2016    Current Outpatient Medications  Medication Sig Dispense Refill   amiodarone (PACERONE) 200 MG tablet Take 1 tablet (200 mg total) by mouth daily. 30 tablet 5   aspirin 81 MG EC tablet Take 81 mg by mouth daily.     atorvastatin (LIPITOR) 80 MG tablet Take 80 mg by mouth daily.     Dulaglutide (TRULICITY) 1.5 MG/0.5ML SOPN INJECT 0.5 MLS (1.5 MG TOTAL) SUBCUTANEOUSLY EVERY 7 (  SEVEN) DAYS FOR 30 DAYS     glipiZIDE (GLUCOTROL XL) 10 MG 24 hr tablet Take 1 tablet by mouth daily.     JARDIANCE 25 MG TABS tablet Take 25 mg by mouth daily.     metoprolol succinate (TOPROL-XL) 100 MG 24 hr tablet Take 1 tablet (100 mg total) by mouth daily. Take with or immediately following a meal. 90 tablet 3   rivaroxaban (XARELTO) 20 MG TABS tablet Take 20 mg by mouth daily in the afternoon.     sacubitril-valsartan (ENTRESTO) 24-26 MG Take 1 tablet by mouth 2 (two) times daily. 60 tablet 5   torsemide  (DEMADEX) 20 MG tablet Take 40 mg by mouth daily.     nicotine (NICODERM CQ - DOSED IN MG/24 HOURS) 21 mg/24hr patch 21 mg daily. (Patient not taking: Reported on 06/29/2021)     No current facility-administered medications for this encounter.    Allergies  Allergen Reactions   Pseudoephedrine     starts him in A fib    Social History   Socioeconomic History   Marital status: Married    Spouse name: Not on file   Number of children: Not on file   Years of education: Not on file   Highest education level: Not on file  Occupational History   Not on file  Tobacco Use   Smoking status: Every Day    Packs/day: 0.50    Pack years: 0.00    Types: Cigarettes   Smokeless tobacco: Never  Vaping Use   Vaping Use: Never used  Substance and Sexual Activity   Alcohol use: No   Drug use: Not Currently    Types: Marijuana    Comment: as a teen   Sexual activity: Not Currently  Other Topics Concern   Not on file  Social History Narrative   Not on file   Social Determinants of Health   Financial Resource Strain: Not on file  Food Insecurity: Not on file  Transportation Needs: Not on file  Physical Activity: Not on file  Stress: Not on file  Social Connections: Not on file  Intimate Partner Violence: Not on file     ROS- All systems are reviewed and negative except as per the HPI above.  Physical Exam: Vitals:   06/29/21 1347  BP: 108/62  Pulse: 73  Weight: 114 kg  Height: 6\' 3"  (1.905 m)    GEN- The patient is a well appearing obese male, alert and oriented x 3 today.   Head- normocephalic, atraumatic Eyes-  Sclera clear, conjunctiva pink Ears- hearing intact Oropharynx- clear Neck- supple  Lungs- Clear to ausculation bilaterally, normal work of breathing Heart- regular rate and rhythm, no murmurs, rubs or gallops  GI- soft, NT, ND, + BS Extremities- no clubbing, cyanosis, or edema MS- no significant deformity or atrophy Skin- no rash or lesion Psych- euthymic  mood, full affect Neuro- strength and sensation are intact  Wt Readings from Last 3 Encounters:  06/29/21 114 kg  06/09/21 110.2 kg  06/06/21 113.1 kg    EKG today demonstrates SR Vent. rate 73 BPM PR interval 152 ms QRS duration 92 ms QT/QTcB 426/469 ms P-R-T axes 74 40 77  Echo 12//19 demonstrated  - Left ventricle: Systolic function was severely reduced. The    estimated ejection fraction was in the range of 20% to 25%.  - Mitral valve: There was moderate regurgitation.  - Right ventricle: The cavity size was mildly dilated. Systolic    pressure was  increased.  - Right atrium: The atrium was mildly dilated.  - Tricuspid valve: There was moderate regurgitation.  - Pulmonary arteries: PA peak pressure: 40 mm Hg (S).   Epic records are reviewed at length today  CHA2DS2-VASc Score = 4  The patient's score is based upon: CHF History: Yes HTN History: Yes Diabetes History: Yes Stroke History: No Vascular Disease History: Yes Age Score: 0 Gender Score: 0     ASSESSMENT AND PLAN: 1. Persistent Atrial Fibrillation (ICD10:  I48.19) The patient's CHA2DS2-VASc score is 4, indicating a 4.8% annual risk of stroke.   Patient presented  for dofetilide admission last week but had to be stopped for prolonged qt.   He was changed to amiodarone 200 mg daily after drip Successfully cardioverted with with ERAF He is now in SR  with acceptable qt Continue Xarelto 20 mg daily  Continue Toprol 100 mg daily  2. Secondary Hypercoagulable State (ICD10:  D68.69) The patient is at significant risk for stroke/thromboembolism based upon his CHA2DS2-VASc Score of 4.  Continue Rivaroxaban (Xarelto).   3. Obesity Body mass index is 31.42 kg/m. Lifestyle modification was discussed at length including regular exercise and weight reduction.  4. Chronic combined systolic and diastolic CHF EF 95-09% No signs or symptoms of fluid overload today.  5. CAD No anginal symptoms.  6. HTN Stable,  no changes today.   F/u with A. [place], [place] 07/21/21   Elvina Sidle [facility] [address] [place], [place] [address] [phone]  06/29/2021 2:42 PM

## 2021-07-08 ENCOUNTER — Telehealth (HOSPITAL_COMMUNITY): Payer: Self-pay

## 2021-07-08 NOTE — Telephone Encounter (Signed)
A user error has taken place: encounter opened in error, closed for administrative reasons.

## 2021-07-08 NOTE — Progress Notes (Signed)
A user error has taken place: encounter opened in error, closed for administrative reasons.

## 2021-07-10 ENCOUNTER — Telehealth: Payer: Self-pay | Admitting: Medical

## 2021-07-10 MED ORDER — SACUBITRIL-VALSARTAN 24-26 MG PO TABS: 1.0000 | ORAL_TABLET | Freq: Two times a day (BID) | ORAL | 3 refills | Status: DC

## 2021-07-10 MED ORDER — AMIODARONE HCL 200 MG PO TABS: 200.0000 mg | ORAL_TABLET | Freq: Every day | ORAL | 3 refills | Status: DC

## 2021-07-10 NOTE — Telephone Encounter (Signed)
   Patient called the after hours line to report needing refills of amiodarone and entresto which were prescribed at discharge 06/09/21, however TOC pharmacy did not transfer to his preferred pharmacy ([facility] in [place]). Rx submitted as requested. Patient was appreciative of the call.  Beatriz Stallion, PA-C 07/10/21; 3:34 PM

## 2021-07-19 ENCOUNTER — Ambulatory Visit: Payer: BC Managed Care – PPO | Admitting: Student

## 2021-07-19 NOTE — Progress Notes (Deleted)
PCP:  Patient, No Pcp Per (Inactive) Primary Cardiologist: None Electrophysiologist: None   [name] is a 60 y.o. male seen today for None for {Blank single:19197::"cardiac clearance","post hospital follow up","acute visit due to ***","routine electrophysiology followup"}.  Since {Blank single:19197::"last being seen in our clinic","discharge from hospital"} the patient reports doing ***.  he denies chest pain, palpitations, dyspnea, PND, orthopnea, nausea, vomiting, dizziness, syncope, edema, weight gain, or early satiety.  Past Medical History:  Diagnosis Date   Atrial fibrillation [facility])    Atrial fibrillation (HCC)    CHF (congestive heart failure) (HCC)    Coronary artery disease 2016   1 stent   Diabetes mellitus without complication [facility])    ED (erectile dysfunction)    Hepatitis C    Hypertension    Obesity    Past Surgical History:  Procedure Laterality Date   CARDIOVERSION N/A 01/08/2019   Procedure: CARDIOVERSION (CATH LAB);  Surgeon: Marcina Millard, MD;  Location: [facility] ORS;  Service: Cardiovascular;  Laterality: N/A;   CARDIOVERSION N/A 06/09/2021   Procedure: CARDIOVERSION;  Surgeon: Wendall Stade, MD;  Location: [facility] ENDOSCOPY;  Service: Cardiovascular;  Laterality: N/A;   CORONARY STENT PLACEMENT  2016    Current Outpatient Medications  Medication Sig Dispense Refill   amiodarone (PACERONE) 200 MG tablet Take 1 tablet (200 mg total) by mouth daily. 90 tablet 3   aspirin 81 MG EC tablet Take 81 mg by mouth daily.     atorvastatin (LIPITOR) 80 MG tablet Take 80 mg by mouth daily.     Dulaglutide (TRULICITY) 1.5 MG/0.5ML SOPN INJECT 0.5 MLS (1.5 MG TOTAL) SUBCUTANEOUSLY EVERY 7 (SEVEN) DAYS FOR 30 DAYS     glipiZIDE (GLUCOTROL XL) 10 MG 24 hr tablet Take 1 tablet by mouth daily.     JARDIANCE 25 MG TABS tablet Take 25 mg by mouth daily.     metoprolol succinate (TOPROL-XL) 100 MG 24 hr tablet Take 1 tablet (100 mg total) by mouth daily. Take with or  immediately following a meal. 90 tablet 3   nicotine (NICODERM CQ - DOSED IN MG/24 HOURS) 21 mg/24hr patch 21 mg daily. (Patient not taking: Reported on 06/29/2021)     rivaroxaban (XARELTO) 20 MG TABS tablet Take 20 mg by mouth daily in the afternoon.     sacubitril-valsartan (ENTRESTO) 24-26 MG Take 1 tablet by mouth 2 (two) times daily. 180 tablet 3   torsemide (DEMADEX) 20 MG tablet Take 40 mg by mouth daily.     No current facility-administered medications for this visit.    Allergies  Allergen Reactions   Pseudoephedrine     starts him in A fib    Social History   Socioeconomic History   Marital status: Married    Spouse name: Not on file   Number of children: Not on file   Years of education: Not on file   Highest education level: Not on file  Occupational History   Not on file  Tobacco Use   Smoking status: Every Day    Packs/day: 0.50    Types: Cigarettes   Smokeless tobacco: Never  Vaping Use   Vaping Use: Never used  Substance and Sexual Activity   Alcohol use: No   Drug use: Not Currently    Types: Marijuana    Comment: as a teen   Sexual activity: Not Currently  Other Topics Concern   Not on file  Social History Narrative   Not on file   Social Determinants of  Health   Financial Resource Strain: Not on file  Food Insecurity: Not on file  Transportation Needs: Not on file  Physical Activity: Not on file  Stress: Not on file  Social Connections: Not on file  Intimate Partner Violence: Not on file     Review of Systems: General: No chills, fever, night sweats or weight changes  Cardiovascular:  No chest pain, dyspnea on exertion, edema, orthopnea, palpitations, paroxysmal nocturnal dyspnea Dermatological: No rash, lesions or masses Respiratory: No cough, dyspnea Urologic: No hematuria, dysuria Abdominal: No nausea, vomiting, diarrhea, bright red blood per rectum, melena, or hematemesis Neurologic: No visual changes, weakness, changes in mental  status All other systems reviewed and are otherwise negative except as noted above.  Physical Exam: There were no vitals filed for this visit.  GEN- The patient is well appearing, alert and oriented x 3 today.   HEENT: normocephalic, atraumatic; sclera clear, conjunctiva pink; hearing intact; oropharynx clear; neck supple, no JVP Lymph- no cervical lymphadenopathy Lungs- Clear to ausculation bilaterally, normal work of breathing.  No wheezes, rales, rhonchi Heart- Regular rate and rhythm, no murmurs, rubs or gallops, PMI not laterally displaced GI- soft, non-tender, non-distended, bowel sounds present, no hepatosplenomegaly Extremities- no clubbing, cyanosis, or edema; DP/PT/radial pulses 2+ bilaterally MS- no significant deformity or atrophy Skin- warm and dry, no rash or lesion Psych- euthymic mood, full affect Neuro- strength and sensation are intact  EKG is not ordered.   Additional studies reviewed include: Previous EP and AF clinic notes  Assessment and Plan:  1. Persistent Atrial Fibrillation (ICD10:  I48.19) The patient's CHA2DS2-VASc score is 4, indicating a 4.8% annual risk of stroke.   Failed tikosyn with prolonged QT Continue amiodarone 200 mg daily.  EKG today shows *** Continue Xarelto 20 mg daily Continue Toprol 100 mg daily   2. Secondary Hypercoagulable State (ICD10:  D68.69) The patient is at significant risk for stroke/thromboembolism based upon his CHA2DS2-VASc Score of 4.   Continue Rivaroxaban (Xarelto). Denies bleeding.   3. Obesity There is no height or weight on file to calculate BMI.  Lifestyle modification was discussed at length including regular exercise and weight reduction.   4. Chronic combined systolic and diastolic CHF Echo 06/07/2021 LVEF 20-25% NYHA *** symptoms Volume status *** on exam.  Continue toprol 100 mg daily Continue entresto 24/26 mg BID Continue jardiance 25 mg daily Consider spiro Continue torsemide 40 mg daily.    5.  CAD Denies anginal symptoms.   6. HTN Adjust medications as tolerated for GDMT as above.   Graciella Freer, PA-C  07/19/21 2:22 PM

## 2021-07-21 ENCOUNTER — Ambulatory Visit: Payer: BC Managed Care – PPO | Admitting: Student

## 2021-07-21 DIAGNOSIS — I5022 Chronic systolic (congestive) heart failure: Secondary | ICD-10-CM

## 2021-07-21 DIAGNOSIS — D6869 Other thrombophilia: Secondary | ICD-10-CM

## 2021-07-21 DIAGNOSIS — E11621 Type 2 diabetes mellitus with foot ulcer: Secondary | ICD-10-CM

## 2021-07-21 DIAGNOSIS — I4819 Other persistent atrial fibrillation: Secondary | ICD-10-CM

## 2021-08-11 NOTE — Progress Notes (Signed)
PCP:  Laurine Blazer, MD Primary Cardiologist: None Electrophysiologist: Lanier Prude, MD   [name] is a 60 y.o. male seen today for Lanier Prude, MD for routine electrophysiology followup.  Since last being seen in our clinic the patient reports doing very well. Does not feel like he's had any more AF, even with COVID last month. He feels a little run down since getting over COVID, but overall doing well.  he denies chest pain, palpitations, dyspnea, PND, orthopnea, nausea, vomiting, dizziness, syncope, edema, weight gain, or early satiety.  Past Medical History:  Diagnosis Date   Atrial fibrillation [facility])    Atrial fibrillation (HCC)    CHF (congestive heart failure) (HCC)    Coronary artery disease 2016   1 stent   Diabetes mellitus without complication [facility])    ED (erectile dysfunction)    Hepatitis C    Hypertension    Obesity    Past Surgical History:  Procedure Laterality Date   CARDIOVERSION N/A 01/08/2019   Procedure: CARDIOVERSION (CATH LAB);  Surgeon: Marcina Millard, MD;  Location: [facility] ORS;  Service: Cardiovascular;  Laterality: N/A;   CARDIOVERSION N/A 06/09/2021   Procedure: CARDIOVERSION;  Surgeon: Wendall Stade, MD;  Location: [facility] ENDOSCOPY;  Service: Cardiovascular;  Laterality: N/A;   CORONARY STENT PLACEMENT  2016    Current Outpatient Medications  Medication Sig Dispense Refill   amiodarone (PACERONE) 200 MG tablet Take 1 tablet (200 mg total) by mouth daily. 90 tablet 3   aspirin 81 MG EC tablet Take 81 mg by mouth daily.     atorvastatin (LIPITOR) 80 MG tablet Take 80 mg by mouth daily.     Dulaglutide (TRULICITY) 1.5 MG/0.5ML SOPN INJECT 0.5 MLS (1.5 MG TOTAL) SUBCUTANEOUSLY EVERY 7 (SEVEN) DAYS FOR 30 DAYS     glipiZIDE (GLUCOTROL XL) 10 MG 24 hr tablet Take 1 tablet by mouth daily.     JARDIANCE 25 MG TABS tablet Take 25 mg by mouth daily.     nicotine (NICODERM CQ - DOSED IN MG/24 HOURS) 21 mg/24hr patch 21 mg daily.      rivaroxaban (XARELTO) 20 MG TABS tablet Take 20 mg by mouth daily in the afternoon.     sacubitril-valsartan (ENTRESTO) 24-26 MG Take 1 tablet by mouth 2 (two) times daily. 180 tablet 3   torsemide (DEMADEX) 20 MG tablet Take 40 mg by mouth daily.     metoprolol succinate (TOPROL-XL) 100 MG 24 hr tablet Take 1 tablet (100 mg total) by mouth daily. Take with or immediately following a meal. 90 tablet 3   No current facility-administered medications for this visit.    Allergies  Allergen Reactions   Pseudoephedrine     starts him in A fib    Social History   Socioeconomic History   Marital status: Married    Spouse name: Not on file   Number of children: Not on file   Years of education: Not on file   Highest education level: Not on file  Occupational History   Not on file  Tobacco Use   Smoking status: Every Day    Packs/day: 0.50    Types: Cigarettes   Smokeless tobacco: Never  Vaping Use   Vaping Use: Never used  Substance and Sexual Activity   Alcohol use: No   Drug use: Not Currently    Types: Marijuana    Comment: as a teen   Sexual activity: Not Currently  Other Topics Concern   Not on  file  Social History Narrative   Not on file   Social Determinants of Health   Financial Resource Strain: Not on file  Food Insecurity: Not on file  Transportation Needs: Not on file  Physical Activity: Not on file  Stress: Not on file  Social Connections: Not on file  Intimate Partner Violence: Not on file     Review of Systems: All other systems reviewed and are otherwise negative except as noted above.  Physical Exam: Vitals:   08/12/21 1150  BP: 116/68  Pulse: 75  SpO2: 94%  Weight: 256 lb 3.2 oz (116.2 kg)  Height: 6\' 3"  (1.905 m)    GEN- The patient is well appearing, alert and oriented x 3 today.   HEENT: normocephalic, atraumatic; sclera clear, conjunctiva pink; hearing intact; oropharynx clear; neck supple, no JVP Lymph- no cervical  lymphadenopathy Lungs- Clear to ausculation bilaterally, normal work of breathing.  No wheezes, rales, rhonchi Heart- Regular rate and rhythm, no murmurs, rubs or gallops, PMI not laterally displaced GI- soft, non-tender, non-distended, bowel sounds present, no hepatosplenomegaly Extremities- no clubbing, cyanosis, or edema; DP/PT/radial pulses 2+ bilaterally MS- no significant deformity or atrophy Skin- warm and dry, no rash or lesion Psych- euthymic mood, full affect Neuro- strength and sensation are intact  EKG is ordered. Personal review of EKG from today shows NSR at 68 bpm  Additional studies reviewed include: Previous EP and AF office notes. Recent admission notes.   Assessment and Plan:  1. Persistent atrial fibrillation Continue Xarelto for CHA2DS2/VASc of at least 4. He had QT prolongation on tikosyn -> changed to amiodarone EKG today shows NSR  2. Chronic combined CHF EF 25% NYHA II-III symptoms Volume status stable on exam today.  Continue toprol  Continue Entresto Continue Jardiance   3. CAD Denies anginal symptoms.  4. Leg wounds Well healed, has graduated from wound clinic   Leg wounds now well healed. Amio labs today. RTC to see Dr. in 3 months to further discuss the potential for ablation if he remains compliant with medications.    Lalla Brothers, PA-C  08/12/21 12:07 PM

## 2021-08-12 ENCOUNTER — Encounter: Payer: Self-pay | Admitting: Student

## 2021-08-12 ENCOUNTER — Ambulatory Visit (INDEPENDENT_AMBULATORY_CARE_PROVIDER_SITE_OTHER): Payer: BC Managed Care – PPO | Admitting: Student

## 2021-08-12 ENCOUNTER — Other Ambulatory Visit: Payer: Self-pay

## 2021-08-12 VITALS — BP 116/68 | HR 75 | Ht 75.0 in | Wt 256.2 lb

## 2021-08-12 DIAGNOSIS — I5022 Chronic systolic (congestive) heart failure: Secondary | ICD-10-CM

## 2021-08-12 DIAGNOSIS — I4819 Other persistent atrial fibrillation: Secondary | ICD-10-CM

## 2021-08-12 DIAGNOSIS — I2510 Atherosclerotic heart disease of native coronary artery without angina pectoris: Secondary | ICD-10-CM

## 2021-08-12 DIAGNOSIS — L97509 Non-pressure chronic ulcer of other part of unspecified foot with unspecified severity: Secondary | ICD-10-CM

## 2021-08-12 DIAGNOSIS — E11621 Type 2 diabetes mellitus with foot ulcer: Secondary | ICD-10-CM | POA: Diagnosis not present

## 2021-08-12 LAB — COMPREHENSIVE METABOLIC PANEL
ALT: 46 IU/L — ABNORMAL HIGH (ref 0–44)
AST: 28 IU/L (ref 0–40)
Albumin/Globulin Ratio: 1.4 (ref 1.2–2.2)
Albumin: 4.2 g/dL (ref 3.8–4.8)
Alkaline Phosphatase: 80 IU/L (ref 44–121)
BUN/Creatinine Ratio: 32 — ABNORMAL HIGH (ref 10–24)
BUN: 34 mg/dL — ABNORMAL HIGH (ref 8–27)
Bilirubin Total: 0.7 mg/dL (ref 0.0–1.2)
CO2: 22 mmol/L (ref 20–29)
Calcium: 9.2 mg/dL (ref 8.6–10.2)
Chloride: 95 mmol/L — ABNORMAL LOW (ref 96–106)
Creatinine, Ser: 1.06 mg/dL (ref 0.76–1.27)
Globulin, Total: 3.1 g/dL (ref 1.5–4.5)
Glucose: 228 mg/dL — ABNORMAL HIGH (ref 65–99)
Potassium: 4.9 mmol/L (ref 3.5–5.2)
Sodium: 133 mmol/L — ABNORMAL LOW (ref 134–144)
Total Protein: 7.3 g/dL (ref 6.0–8.5)
eGFR: 80 mL/min/{1.73_m2} (ref >59)

## 2021-08-12 LAB — CBC WITH DIFFERENTIAL/PLATELET
Basophils Absolute: 0.1 10*3/uL (ref 0.0–0.2)
Basos: 1 %
EOS (ABSOLUTE): 0.1 10*3/uL (ref 0.0–0.4)
Eos: 1 %
Hematocrit: 45.2 % (ref 37.5–51.0)
Hemoglobin: 15.7 g/dL (ref 13.0–17.7)
Immature Grans (Abs): 0.1 10*3/uL (ref 0.0–0.1)
Immature Granulocytes: 1 %
Lymphocytes Absolute: 3.4 10*3/uL — ABNORMAL HIGH (ref 0.7–3.1)
Lymphs: 30 %
MCH: 31 pg (ref 26.6–33.0)
MCHC: 34.7 g/dL (ref 31.5–35.7)
MCV: 89 fL (ref 79–97)
Monocytes Absolute: 1.4 10*3/uL — ABNORMAL HIGH (ref 0.1–0.9)
Monocytes: 12 %
Neutrophils Absolute: 6.5 10*3/uL (ref 1.4–7.0)
Neutrophils: 55 %
Platelets: 168 10*3/uL (ref 150–450)
RBC: 5.06 x10E6/uL (ref 4.14–5.80)
RDW: 12.2 % (ref 11.6–15.4)
WBC: 11.6 10*3/uL — ABNORMAL HIGH (ref 3.4–10.8)

## 2021-08-12 LAB — TSH: TSH: 3.76 u[IU]/mL (ref 0.450–4.500)

## 2021-08-12 NOTE — Patient Instructions (Signed)
Medication Instructions:  Your physician recommends that you continue on your current medications as directed. Please refer to the Current Medication list given to you today.  *If you need a refill on your cardiac medications before your next appointment, please call your pharmacy*   Lab Work: TODAY: CMET, CBC, TSH  If you have labs (blood work) drawn today and your tests are completely normal, you will receive your results only by: MyChart Message (if you have MyChart) OR A paper copy in the mail If you have any lab test that is abnormal or we need to change your treatment, we will call you to review the results.   Follow-Up: At [facility], you and your health needs are our priority.  As part of our continuing mission to provide you with exceptional heart care, we have created designated Provider Care Teams.  These Care Teams include your primary Cardiologist (physician) and Advanced Practice Providers (APPs -  Physician Assistants and Nurse Practitioners) who all work together to provide you with the care you need, when you need it.   Your next appointment:   3 month(s)  The format for your next appointment:   In Person  Provider:   You may see Lanier Prude, MD or one of the following Advanced Practice Providers on your designated Care Team:   Francis Dowse, [place] "[facility]" [place], [place]

## 2021-11-23 NOTE — Progress Notes (Signed)
Electrophysiology Office Follow up Visit Note:    Date:  11/24/2021   ID:  [name], DOB March 17, 1961, MRN [id number]  PCP:  Laurine Blazer, MD  [facility] Cardiologist:  None  [facility] Electrophysiologist:  Lanier Prude, MD    Interval History:    [name] is a 59 y.o. male who presents for a follow up visit.  He last seen in clinic by Otilio Saber on August 12, 2021 for persistent atrial fibrillation.  Given his systolic heart failure, rhythm control was important.  Tikosyn was attempted but he failed because of QTC prolongation and was transition to amiodarone.  He was maintaining sinus rhythm at the time of his appointment with Otilio Saber.  He reported NYHA class II symptoms at that appointment.  Follow-up was scheduled with me today to discuss ablation as a mechanism to avoid long-term exposure to amiodarone.  He saw Dr. Darrold Junker with [facility] on November 03, 2021.  Today he tells me he is doing well overall.  His leg wounds have improved significantly.  They are no longer infected but he still occasionally gets to a sore.  He is getting more active.  He is working on stopping smoking.     Past Medical History:  Diagnosis Date   Atrial fibrillation [facility])    Atrial fibrillation (HCC)    CHF (congestive heart failure) (HCC)    Coronary artery disease 2016   1 stent   Diabetes mellitus without complication [facility])    ED (erectile dysfunction)    Hepatitis C    Hypertension    Obesity     Past Surgical History:  Procedure Laterality Date   CARDIOVERSION N/A 01/08/2019   Procedure: CARDIOVERSION (CATH LAB);  Surgeon: Marcina Millard, MD;  Location: [facility] ORS;  Service: Cardiovascular;  Laterality: N/A;   CARDIOVERSION N/A 06/09/2021   Procedure: CARDIOVERSION;  Surgeon: Wendall Stade, MD;  Location: [facility] ENDOSCOPY;  Service: Cardiovascular;  Laterality: N/A;   CORONARY STENT PLACEMENT  2016    Current Medications: Current Meds   Medication Sig   amiodarone (PACERONE) 200 MG tablet Take 1 tablet (200 mg total) by mouth daily.   aspirin 81 MG EC tablet Take 81 mg by mouth daily.   atorvastatin (LIPITOR) 80 MG tablet Take 80 mg by mouth daily.   Dulaglutide (TRULICITY) 1.5 MG/0.5ML SOPN INJECT 0.5 MLS (1.5 MG TOTAL) SUBCUTANEOUSLY EVERY 7 (SEVEN) DAYS FOR 30 DAYS   glipiZIDE (GLUCOTROL XL) 10 MG 24 hr tablet Take 1 tablet by mouth daily.   JARDIANCE 25 MG TABS tablet Take 25 mg by mouth daily.   metoprolol succinate (TOPROL-XL) 100 MG 24 hr tablet Take 1 tablet (100 mg total) by mouth daily. Take with or immediately following a meal.   rivaroxaban (XARELTO) 20 MG TABS tablet Take 20 mg by mouth daily in the afternoon.   sacubitril-valsartan (ENTRESTO) 24-26 MG Take 1 tablet by mouth 2 (two) times daily.   torsemide (DEMADEX) 20 MG tablet Take 40 mg by mouth daily.     Allergies:   Pseudoephedrine   Social History   Socioeconomic History   Marital status: Married    Spouse name: Not on file   Number of children: Not on file   Years of education: Not on file   Highest education level: Not on file  Occupational History   Not on file  Tobacco Use   Smoking status: Every Day    Packs/day: 0.50    Types: Cigarettes  Smokeless tobacco: Never  Vaping Use   Vaping Use: Never used  Substance and Sexual Activity   Alcohol use: No   Drug use: Not Currently    Types: Marijuana    Comment: as a teen   Sexual activity: Not Currently  Other Topics Concern   Not on file  Social History Narrative   Not on file   Social Determinants of Health   Financial Resource Strain: Not on file  Food Insecurity: Not on file  Transportation Needs: Not on file  Physical Activity: Not on file  Stress: Not on file  Social Connections: Not on file     Family History: The patient's family history includes Alzheimer's disease in his father; COPD in his father; Heart disease in his father; Hyperlipidemia in his father;  Hypertension in his father; Other (age of onset: 84) in his mother; Thyroid disease in his mother.  ROS:   Please see the history of present illness.    All other systems reviewed and are negative.  EKGs/Labs/Other Studies Reviewed:    The following studies were reviewed today:    EKG:  The ekg ordered today demonstrates sinus rhythm.  QTC 440 ms.  Recent Labs: 06/09/2021: Magnesium 2.1 08/12/2021: ALT 46; BUN 34; Creatinine, Ser 1.06; Hemoglobin 15.7; Platelets 168; Potassium 4.9; Sodium 133; TSH 3.760  Recent Lipid Panel    Component Value Date/Time   CHOL 143 06/08/2021 0315   TRIG 83 06/08/2021 0315   HDL 44 06/08/2021 0315   CHOLHDL 3.3 06/08/2021 0315   VLDL 17 06/08/2021 0315   LDLCALC 82 06/08/2021 0315    Physical Exam:    VS:  BP 120/64   Pulse 63   Ht 6\' 3"  (1.905 m)   Wt 259 lb (117.5 kg)   SpO2 97%   BMI 32.37 kg/m     Wt Readings from Last 3 Encounters:  11/24/21 259 lb (117.5 kg)  08/12/21 256 lb 3.2 oz (116.2 kg)  06/29/21 251 lb 6.4 oz (114 kg)     GEN:  Well nourished, well developed in no acute distress.  Obese HEENT: Normal NECK: No JVD; No carotid bruits LYMPHATICS: No lymphadenopathy CARDIAC: RRR, no murmurs, rubs, gallops RESPIRATORY:  Clear to auscultation without rales, wheezing or rhonchi  ABDOMEN: Soft, non-tender, non-distended MUSCULOSKELETAL:  No edema; No deformity  SKIN: Warm and dry NEUROLOGIC:  Alert and oriented x 3 PSYCHIATRIC:  Normal affect        ASSESSMENT:    1. Persistent atrial fibrillation ([place])   2. Chronic systolic heart failure ([place])   3. Type 2 diabetes mellitus with foot ulcer, without long-term current use of insulin ([place])   4. Chronic hepatitis C without hepatic coma (HCC)    PLAN:    In order of problems listed above:  #Persistent atrial fibrillation On Xarelto for stroke prophylaxis.  Maintaining sinus rhythm on amiodarone.  We will need to recheck TSH, free T4 and CMP today.  I will have him  follow-up with an APP in 6 months.  We did discuss ablation during today's appointment.  He is interested long-term in pursuing ablation.  I agree with the patient that waiting a little bit longer would be ideal to give his legs more time to heal.  We will reassess his candidacy at the follow-up in 6 months.  If the APP determines that the leg wounds have healed sufficiently, would plan to pursue ablation.  #Chronic systolic heart failure NYHA class II today.  Warm and dry  on exam.  Continue Entresto, Jardiance, metoprolol and torsemide.  #Diabetes      Follow-up 6 months with APP.  CMP, TSH and free T4 at that appointment.  If leg wounds have healed completely at that appointment, can schedule virtual visit/telephone visit with me to discuss scheduling a catheter ablation.      Medication Adjustments/Labs and Tests Ordered: Current medicines are reviewed at length with the patient today.  Concerns regarding medicines are outlined above.  No orders of the defined types were placed in this encounter.  No orders of the defined types were placed in this encounter.    Signed, Lars Mage, MD, [facility], [facility] 11/24/2021 12:19 PM    Electrophysiology [facility]

## 2021-11-24 ENCOUNTER — Other Ambulatory Visit: Payer: Self-pay

## 2021-11-24 ENCOUNTER — Encounter: Payer: Self-pay | Admitting: Cardiology

## 2021-11-24 ENCOUNTER — Ambulatory Visit (INDEPENDENT_AMBULATORY_CARE_PROVIDER_SITE_OTHER): Payer: BC Managed Care – PPO | Admitting: Cardiology

## 2021-11-24 VITALS — BP 120/64 | HR 63 | Ht 75.0 in | Wt 259.0 lb

## 2021-11-24 DIAGNOSIS — E11621 Type 2 diabetes mellitus with foot ulcer: Secondary | ICD-10-CM | POA: Diagnosis not present

## 2021-11-24 DIAGNOSIS — I5022 Chronic systolic (congestive) heart failure: Secondary | ICD-10-CM | POA: Diagnosis not present

## 2021-11-24 DIAGNOSIS — L97509 Non-pressure chronic ulcer of other part of unspecified foot with unspecified severity: Secondary | ICD-10-CM

## 2021-11-24 DIAGNOSIS — B182 Chronic viral hepatitis C: Secondary | ICD-10-CM

## 2021-11-24 DIAGNOSIS — I4819 Other persistent atrial fibrillation: Secondary | ICD-10-CM | POA: Diagnosis not present

## 2021-11-24 LAB — COMPREHENSIVE METABOLIC PANEL
ALT: 56 IU/L — ABNORMAL HIGH (ref 0–44)
AST: 40 IU/L (ref 0–40)
Albumin/Globulin Ratio: 1.2 (ref 1.2–2.2)
Albumin: 4 g/dL (ref 3.8–4.8)
Alkaline Phosphatase: 107 IU/L (ref 44–121)
BUN/Creatinine Ratio: 14 (ref 10–24)
BUN: 12 mg/dL (ref 8–27)
Bilirubin Total: 0.6 mg/dL (ref 0.0–1.2)
CO2: 22 mmol/L (ref 20–29)
Calcium: 8.7 mg/dL (ref 8.6–10.2)
Chloride: 104 mmol/L (ref 96–106)
Creatinine, Ser: 0.84 mg/dL (ref 0.76–1.27)
Globulin, Total: 3.4 g/dL (ref 1.5–4.5)
Glucose: 212 mg/dL — ABNORMAL HIGH (ref 70–99)
Potassium: 4.5 mmol/L (ref 3.5–5.2)
Sodium: 138 mmol/L (ref 134–144)
Total Protein: 7.4 g/dL (ref 6.0–8.5)
eGFR: 99 mL/min/{1.73_m2} (ref >59)

## 2021-11-24 LAB — TSH: TSH: 3.01 u[IU]/mL (ref 0.450–4.500)

## 2021-11-24 LAB — T4, FREE: Free T4: 1.53 ng/dL (ref 0.82–1.77)

## 2021-11-24 NOTE — Patient Instructions (Addendum)
Medication Instructions:  Your physician recommends that you continue on your current medications as directed. Please refer to the Current Medication list given to you today. *If you need a refill on your cardiac medications before your next appointment, please call your pharmacy*  Lab Work: None. If you have labs (blood work) drawn today and your tests are completely normal, you will receive your results only by: MyChart Message (if you have MyChart) OR A paper copy in the mail If you have any lab test that is abnormal or we need to change your treatment, we will call you to review the results.  Testing/Procedures: CMP, TSH, Free T4  Follow-Up: At [facility], you and your health needs are our priority.  As part of our continuing mission to provide you with exceptional heart care, we have created designated Provider Care Teams.  These Care Teams include your primary Cardiologist (physician) and Advanced Practice Providers (APPs -  Physician Assistants and Nurse Practitioners) who all work together to provide you with the care you need, when you need it.  Your physician wants you to follow-up in: 6 months with   one of the following Advanced Practice Providers on your designated Care Team:    Francis Dowse, [place] Casimiro Needle "Mardelle Matte" Rud[place], [place]   You will receive a reminder letter in the mail two months in advance. If you don't receive a letter, please call our office to schedule the follow-up appointment.  We recommend signing up for the patient portal called "MyChart".  Sign up information is provided on this After Visit Summary.  MyChart is used to connect with patients for Virtual Visits (Telemedicine).  Patients are able to view lab/test results, encounter notes, upcoming appointments, etc.  Non-urgent messages can be sent to your provider as well.   To learn more about what you can do with MyChart, go to [email or web address].    Any Other Special Instructions Will Be Listed Below (If  Applicable).

## 2022-05-19 ENCOUNTER — Ambulatory Visit: Payer: BC Managed Care – PPO | Admitting: Physician Assistant

## 2022-05-19 NOTE — Progress Notes (Deleted)
Cardiology Office Note Date:  05/19/2022  Patient ID:  [name], [name] August 19, 1961, MRN [id number] PCP:  Loraine Grip, MD  Cardiologist:  Dr. Josefa Half Jefm Bryant) Electrophysiologist: Dr .Quentin Ore  ***refresh   Chief Complaint: *** 6 mo  History of Present Illness: [name] is a 61 y.o. male with history of CAD (STEMI, PCI RCA 2016), HTN, HLD, COPD/smoker, chronic CHF (systolic), CM (?ischemic), DM, AFib, obesity.  He comes in today to be seen for Dr. Quentin Ore, last seen by him Dec 2022, he had been struggling with leg wounds, though improving, not quite healed. Planned for a 6 mo APP visit to re evaluate his legs, wounds and plan to  pursue ablation once leg wound had healed up.   *** legs *** amio labs *** xarelto compliance, bleeding *** symptoms *** burden *** volume *** meds, CAD, CM   AFib/AAD hx Diagnosed 2019 Tikosyn  > pt self d/c > re attempted June 2022, failed with QT prolongation  Amiodarone started June 2022  Past Medical History:  Diagnosis Date   Atrial fibrillation [facility])    Atrial fibrillation (HCC)    CHF (congestive heart failure) ([place])    Coronary artery disease 2016   1 stent   Diabetes mellitus without complication [facility])    ED (erectile dysfunction)    Hepatitis C    Hypertension    Obesity     Past Surgical History:  Procedure Laterality Date   CARDIOVERSION N/A 01/08/2019   Procedure: CARDIOVERSION (CATH LAB);  Surgeon: Isaias Cowman, MD;  Location: [place] ORS;  Service: Cardiovascular;  Laterality: N/A;   CARDIOVERSION N/A 06/09/2021   Procedure: CARDIOVERSION;  Surgeon: Josue Hector, MD;  Location: [facility] ENDOSCOPY;  Service: Cardiovascular;  Laterality: N/A;   CORONARY STENT PLACEMENT  2016    Current Outpatient Medications  Medication Sig Dispense Refill   amiodarone (PACERONE) 200 MG tablet Take 1 tablet (200 mg total) by mouth daily. 90 tablet 3   aspirin 81 MG EC tablet Take 81 mg by mouth daily.     atorvastatin  (LIPITOR) 80 MG tablet Take 80 mg by mouth daily.     Dulaglutide (TRULICITY) 1.5 0000000 SOPN INJECT 0.5 MLS (1.5 MG TOTAL) SUBCUTANEOUSLY EVERY 7 (SEVEN) DAYS FOR 30 DAYS     glipiZIDE (GLUCOTROL XL) 10 MG 24 hr tablet Take 1 tablet by mouth daily.     JARDIANCE 25 MG TABS tablet Take 25 mg by mouth daily.     metoprolol succinate (TOPROL-XL) 100 MG 24 hr tablet Take 1 tablet (100 mg total) by mouth daily. Take with or immediately following a meal. 90 tablet 3   nicotine (NICODERM CQ - DOSED IN MG/24 HOURS) 21 mg/24hr patch 21 mg daily. (Patient not taking: Reported on 11/24/2021)     rivaroxaban (XARELTO) 20 MG TABS tablet Take 20 mg by mouth daily in the afternoon.     sacubitril-valsartan (ENTRESTO) 24-26 MG Take 1 tablet by mouth 2 (two) times daily. 180 tablet 3   torsemide (DEMADEX) 20 MG tablet Take 40 mg by mouth daily.     No current facility-administered medications for this visit.    Allergies:   Pseudoephedrine   Social History:  The patient  reports that he has been smoking cigarettes. He has been smoking an average of .5 packs per day. He has never used smokeless tobacco. He reports that he does not currently use drugs after having used the following drugs: Marijuana. He reports that he does not drink alcohol.  Family History:  The patient's family history includes Alzheimer's disease in his father; COPD in his father; Heart disease in his father; Hyperlipidemia in his father; Hypertension in his father; Other (age of onset: 3) in his mother; Thyroid disease in his mother.  ROS:  Please see the history of present illness.    All other systems are reviewed and otherwise negative.   PHYSICAL EXAM:  VS:  There were no vitals taken for this visit. BMI: There is no height or weight on file to calculate BMI. Well nourished, well developed, in no acute distress HEENT: normocephalic, atraumatic Neck: no JVD, carotid bruits or masses Cardiac:  *** RRR; no significant murmurs, no  rubs, or gallops Lungs:  *** CTA b/l, no wheezing, rhonchi or rales Abd: soft, nontender MS: no deformity or *** atrophy Ext: *** no edema Skin: warm and dry, no rash Neuro:  No gross deficits appreciated Psych: euthymic mood, full affect   EKG:  Done today and reviewed by myself shows  ***  06/07/2021: TTE  1. Left ventricular ejection fraction, by estimation, is 25 to 30%. The  left ventricle has severely decreased function. The left ventricle  demonstrates global hypokinesis. The left ventricular internal cavity size  was mildly dilated. Left ventricular  diastolic parameters are indeterminate.   2. Right ventricular systolic function is normal. The right ventricular  size is normal. There is normal pulmonary artery systolic pressure.   3. Left atrial size was severely dilated.   4. Right atrial size was moderately dilated.   5. The mitral valve is normal in structure. Mild mitral valve  regurgitation. No evidence of mitral stenosis.   6. The aortic valve is tricuspid. Aortic valve regurgitation is not  visualized. No aortic stenosis is present.   7. Aortic dilatation noted. There is borderline dilatation of the aortic  root, measuring 39 mm.   8. The inferior vena cava is dilated in size with >50% respiratory  variability, suggesting right atrial pressure of 8 mmHg.    03/14/2021 Echo (care everywhere) Summary    1. Technically difficult study..    2. Echo contrast utilized to enhance endocardial border definition.    3. The left ventricle is mildly dilated in size with normal wall thickness.    4. The left ventricular systolic function is severely decreased, LVEF is  visually estimated at 25-30%.    5. The right ventricle is not well visualized but probably normal in size,  with low normal systolic function.    6. There is mild mitral valve regurgitation.    7. There is mild tricuspid regurgitation.    8. The left atrium is moderately dilated in size.    9. Estimated  pulmonary artery systolic pressure is [id number] + RA pressure (IVC  not well visualized)..      Recent Labs: 06/09/2021: Magnesium 2.1 08/12/2021: Hemoglobin 15.7; Platelets 168 11/24/2021: ALT 56; BUN 12; Creatinine, Ser 0.84; Potassium 4.5; Sodium 138; TSH 3.010  06/08/2021: Cholesterol 143; HDL 44; LDL Cholesterol 82; Total CHOL/HDL Ratio 3.3; Triglycerides 83; VLDL 17   CrCl cannot be calculated (Patient's most recent lab result is older than the maximum 21 days allowed.).   Wt Readings from Last 3 Encounters:  11/24/21 259 lb (117.5 kg)  08/12/21 256 lb 3.2 oz (116.2 kg)  06/29/21 251 lb 6.4 oz (114 kg)     Other studies reviewed: Additional studies/records reviewed today include: summarized above  ASSESSMENT AND PLAN:  Persistent AFib CHA2DS2Vasc is 4, on *** Xarelto,  appropriately dosed Amiodarone ***  CAD ***  CM (?ischemic) Chronic CHF (systolic) ***  Disposition: F/u with ***  Current medicines are reviewed at length with the patient today.  The patient did not have any concerns regarding medicines.  Venetia Night, PA-C 05/19/2022 4:42 AM     [address] [place] [address] [phone] (office)  [phone] (fax)

## 2022-06-12 ENCOUNTER — Ambulatory Visit (INDEPENDENT_AMBULATORY_CARE_PROVIDER_SITE_OTHER): Payer: BC Managed Care – PPO | Admitting: Physician Assistant

## 2022-06-12 ENCOUNTER — Encounter: Payer: Self-pay | Admitting: Physician Assistant

## 2022-06-12 VITALS — BP 109/76 | HR 57 | Ht 75.0 in | Wt 291.8 lb

## 2022-06-12 DIAGNOSIS — I4819 Other persistent atrial fibrillation: Secondary | ICD-10-CM

## 2022-06-12 DIAGNOSIS — I5022 Chronic systolic (congestive) heart failure: Secondary | ICD-10-CM | POA: Diagnosis not present

## 2022-06-12 DIAGNOSIS — I255 Ischemic cardiomyopathy: Secondary | ICD-10-CM

## 2022-06-12 DIAGNOSIS — I2510 Atherosclerotic heart disease of native coronary artery without angina pectoris: Secondary | ICD-10-CM

## 2022-06-13 LAB — CBC
Hematocrit: 47.2 % (ref 37.5–51.0)
Hemoglobin: 15.9 g/dL (ref 13.0–17.7)
MCH: 29.1 pg (ref 26.6–33.0)
MCHC: 33.7 g/dL (ref 31.5–35.7)
MCV: 86 fL (ref 79–97)
Platelets: 116 10*3/uL — ABNORMAL LOW (ref 150–450)
RBC: 5.46 x10E6/uL (ref 4.14–5.80)
RDW: 12.9 % (ref 11.6–15.4)
WBC: 10.8 10*3/uL (ref 3.4–10.8)

## 2022-06-13 LAB — COMPREHENSIVE METABOLIC PANEL
ALT: 50 IU/L — ABNORMAL HIGH (ref 0–44)
AST: 44 IU/L — ABNORMAL HIGH (ref 0–40)
Albumin/Globulin Ratio: 1.3 (ref 1.2–2.2)
Albumin: 4 g/dL (ref 3.8–4.8)
Alkaline Phosphatase: 92 IU/L (ref 44–121)
BUN/Creatinine Ratio: 23 (ref 10–24)
BUN: 19 mg/dL (ref 8–27)
Bilirubin Total: 0.8 mg/dL (ref 0.0–1.2)
CO2: 21 mmol/L (ref 20–29)
Calcium: 8.7 mg/dL (ref 8.6–10.2)
Chloride: 103 mmol/L (ref 96–106)
Creatinine, Ser: 0.84 mg/dL (ref 0.76–1.27)
Globulin, Total: 3.2 g/dL (ref 1.5–4.5)
Glucose: 187 mg/dL — ABNORMAL HIGH (ref 70–99)
Potassium: 4.8 mmol/L (ref 3.5–5.2)
Sodium: 138 mmol/L (ref 134–144)
Total Protein: 7.2 g/dL (ref 6.0–8.5)
eGFR: 99 mL/min/{1.73_m2} (ref >59)

## 2022-06-13 LAB — TSH: TSH: 3.77 u[IU]/mL (ref 0.450–4.500)

## 2022-06-16 ENCOUNTER — Telehealth: Payer: Self-pay | Admitting: *Deleted

## 2022-06-16 DIAGNOSIS — I5022 Chronic systolic (congestive) heart failure: Secondary | ICD-10-CM

## 2022-06-16 NOTE — Telephone Encounter (Signed)
-----   Message from Lanier Prude, MD sent at 06/14/2022  9:12 PM EDT ----- Yes. Goal was to hold onto rhythm and then reassess.  Inetta Fermo, can you order a repeat echo? Thx, CL  ----- Message ----- From: Wylie Hail Sent: 06/12/2022   4:35 PM EDT To: Lanier Prude, MD  Legs looked ok, no real wounds, had a tiny new scrape/abrasion only.  He is scheduled to see you to revisit ablation , is a Dr. Darrold Junker patient, LF last year in the 20's-30%..... just saw him, I didn't see plans for a new echo, ?  Do we think about that and perhaps ICD ????  When they belong to someone else, not sure how to maneuver that

## 2022-06-20 NOTE — Progress Notes (Deleted)
Electrophysiology Office Follow up Visit Note:    Date:  06/20/2022   ID:  [name], DOB [birth year], MRN [id number]  PCP:  Laurine Blazer, MD  [facility] Cardiologist:  None  [facility] Electrophysiologist:  Lanier Prude, MD    Interval History:    [name] is a 61 y.o. male who presents for a follow up visit. They were last seen in clinic by me on November 24, 2021.  The patient has a history of systolic heart failure and rhythm control was planned for his atrial fibrillation.  Tikosyn was attempted but failed because of QTc prolongation and he was transitioned to amiodarone.  We planned to discuss ablation as an outpatient but this was delayed because of lower extremity wounds.  He saw a Renee in follow-up June 12, 2022 and was doing well.  His lower extremity wounds had healed.  He also has a history of chronic systolic heart failure.  His last echo in our system was June 07, 2021 which shows an ejection fraction of 25 to 30%.  He has not had a more recent echo in the [facility] or [facility] systems.       Past Medical History:  Diagnosis Date   Atrial fibrillation [facility])    Atrial fibrillation (HCC)    CHF (congestive heart failure) (HCC)    Coronary artery disease 2016   1 stent   Diabetes mellitus without complication [facility])    ED (erectile dysfunction)    Hepatitis C    Hypertension    Obesity     Past Surgical History:  Procedure Laterality Date   CARDIOVERSION N/A 01/08/2019   Procedure: CARDIOVERSION (CATH LAB);  Surgeon: Marcina Millard, MD;  Location: [facility] ORS;  Service: Cardiovascular;  Laterality: N/A;   CARDIOVERSION N/A 06/09/2021   Procedure: CARDIOVERSION;  Surgeon: Wendall Stade, MD;  Location[facility] ENDOSCOPY;  Service: Cardiovascular;  Laterality: N/A;   CORONARY STENT PLACEMENT  2016    Current Medications: No outpatient medications have been marked as taking for the 06/21/22 encounter (Appointment) with Lanier Prude,  MD.     Allergies:   Pseudoephedrine   Social History   Socioeconomic History   Marital status: Married    Spouse name: Not on file   Number of children: Not on file   Years of education: Not on file   Highest education level: Not on file  Occupational History   Not on file  Tobacco Use   Smoking status: Every Day    Packs/day: 0.50    Types: Cigarettes   Smokeless tobacco: Never  Vaping Use   Vaping Use: Never used  Substance and Sexual Activity   Alcohol use: No   Drug use: Not Currently    Types: Marijuana    Comment: as a teen   Sexual activity: Not Currently  Other Topics Concern   Not on file  Social History Narrative   Not on file   Social Determinants of Health   Financial Resource Strain: Low Risk  (02/05/2019)   Overall Financial Resource Strain (CARDIA)    Difficulty of Paying Living Expenses: Not hard at all  Food Insecurity: No Food Insecurity (02/05/2019)   Hunger Vital Sign    Worried About Running Out of Food in the Last Year: Never true    Ran Out of Food in the Last Year: Never true  Transportation Needs: No Transportation Needs (02/05/2019)   PRAPARE - Transportation    Lack of Transportation (  Medical): No    Lack of Transportation (Non-Medical): No  Physical Activity: Insufficiently Active (02/05/2019)   Exercise Vital Sign    Days of Exercise per Week: 3 days    Minutes of Exercise per Session: 20 min  Stress: No Stress Concern Present (02/05/2019)   [employer] of Occupational Health - Occupational Stress Questionnaire    Feeling of Stress : Not at all  Social Connections: Moderately Integrated (02/05/2019)   Social Connection and Isolation Panel [NHANES]    Frequency of Communication with Friends and Family: More than three times a week    Frequency of Social Gatherings with Friends and Family: More than three times a week    Attends Religious Services: 1 to 4 times per year    Active Member of [employer] or Organizations: No    Attends  Engineer, structural: Never    Marital Status: Married     Family History: The patient's family history includes Alzheimer's disease in his father; COPD in his father; Heart disease in his father; Hyperlipidemia in his father; Hypertension in his father; Other (age of onset: 46) in his mother; Thyroid disease in his mother.  ROS:   Please see the history of present illness.    All other systems reviewed and are negative.  EKGs/Labs/Other Studies Reviewed:    The following studies were reviewed today: ***  EKG:  The ekg ordered today demonstrates ***  Recent Labs: 06/12/2022: ALT 50; BUN 19; Creatinine, Ser 0.84; Hemoglobin 15.9; Platelets 116; Potassium 4.8; Sodium 138; TSH 3.770  Recent Lipid Panel    Component Value Date/Time   CHOL 143 06/08/2021 0315   TRIG 83 06/08/2021 0315   HDL 44 06/08/2021 0315   CHOLHDL 3.3 06/08/2021 0315   VLDL 17 06/08/2021 0315   LDLCALC 82 06/08/2021 0315    Physical Exam:    VS:  There were no vitals taken for this visit.    Wt Readings from Last 3 Encounters:  06/12/22 291 lb 12.8 oz (132.4 kg)  11/24/21 259 lb (117.5 kg)  08/12/21 256 lb 3.2 oz (116.2 kg)     GEN: *** Well nourished, well developed in no acute distress HEENT: Normal NECK: No JVD; No carotid bruits LYMPHATICS: No lymphadenopathy CARDIAC: ***RRR, no murmurs, rubs, gallops RESPIRATORY:  Clear to auscultation without rales, wheezing or rhonchi  ABDOMEN: Soft, non-tender, non-distended MUSCULOSKELETAL:  No edema; No deformity  SKIN: Warm and dry NEUROLOGIC:  Alert and oriented x 3 PSYCHIATRIC:  Normal affect        ASSESSMENT:    No diagnosis found. PLAN:    In order of problems listed above:   Echo then PVI ?  ICD based on echo results          Total time spent with patient today *** minutes. This includes reviewing records, evaluating the patient and coordinating care.   Medication Adjustments/Labs and Tests Ordered: Current  medicines are reviewed at length with the patient today.  Concerns regarding medicines are outlined above.  No orders of the defined types were placed in this encounter.  No orders of the defined types were placed in this encounter.    Signed, Steffanie Dunn, MD, [facility], [facility] 06/20/2022 4:17 PM    Electrophysiology [facility]

## 2022-06-21 ENCOUNTER — Ambulatory Visit: Payer: BC Managed Care – PPO | Admitting: Cardiology

## 2022-06-21 DIAGNOSIS — I5022 Chronic systolic (congestive) heart failure: Secondary | ICD-10-CM

## 2022-06-21 DIAGNOSIS — I4819 Other persistent atrial fibrillation: Secondary | ICD-10-CM

## 2022-06-26 ENCOUNTER — Encounter (HOSPITAL_COMMUNITY): Payer: Self-pay | Admitting: Cardiology

## 2022-07-03 ENCOUNTER — Other Ambulatory Visit: Payer: Self-pay | Admitting: Medical

## 2022-07-09 ENCOUNTER — Other Ambulatory Visit: Payer: Self-pay | Admitting: Medical

## 2022-07-10 MED ORDER — SACUBITRIL-VALSARTAN 24-26 MG PO TABS: 1.0000 | ORAL_TABLET | Freq: Two times a day (BID) | ORAL | 3 refills | Status: AC

## 2022-07-17 ENCOUNTER — Ambulatory Visit (HOSPITAL_COMMUNITY): Payer: BC Managed Care – PPO | Attending: Cardiology

## 2022-07-17 DIAGNOSIS — I5022 Chronic systolic (congestive) heart failure: Secondary | ICD-10-CM | POA: Diagnosis present

## 2022-07-17 LAB — ECHOCARDIOGRAM COMPLETE
Area-P 1/2: 2.1 cm2
S' Lateral: 2.6 cm

## 2023-01-25 ENCOUNTER — Encounter (HOSPITAL_COMMUNITY): Payer: Self-pay | Admitting: *Deleted

## 2023-04-17 ENCOUNTER — Other Ambulatory Visit: Payer: Self-pay | Admitting: Cardiology

## 2023-06-09 ENCOUNTER — Other Ambulatory Visit: Payer: Self-pay | Admitting: Cardiology

## 2023-06-11 ENCOUNTER — Other Ambulatory Visit: Payer: Self-pay | Admitting: Physician Assistant

## 2023-07-14 ENCOUNTER — Other Ambulatory Visit: Payer: Self-pay | Admitting: Cardiology

## 2023-08-28 ENCOUNTER — Other Ambulatory Visit: Payer: Self-pay | Admitting: Cardiology

## 2024-01-28 NOTE — Progress Notes (Signed)
This encounter was created in error - please disregard.

## 2024-01-29 ENCOUNTER — Ambulatory Visit: Payer: BC Managed Care – PPO | Admitting: Cardiology

## 2024-01-29 DIAGNOSIS — Z79899 Other long term (current) drug therapy: Secondary | ICD-10-CM

## 2024-01-29 DIAGNOSIS — D6869 Other thrombophilia: Secondary | ICD-10-CM

## 2024-01-29 DIAGNOSIS — I4819 Other persistent atrial fibrillation: Secondary | ICD-10-CM

## 2024-01-30 NOTE — Progress Notes (Unsigned)
Electrophysiology Clinic Note    Date:  01/28/2024  Patient ID:  [name], [name] 04/04/1961, MRN [id number] PCP:  Laurine Blazer, MD  Cardiologist:  Marcina Millard, MD Electrophysiologist: Lanier Prude, MD  ***refresh  Discussed the use of AI scribe software for clinical note transcription with the patient, who gave verbal consent to proceed.   Patient Profile    Chief Complaint: Afib follow-up, past due  History of Present Illness: [name] is a 63 y.o. male with PMH notable for AFib, CAD s/p PCI, COPD, tobacco use, HTN, HFimpEF, T2DM; seen today for Lanier Prude, MD for routine electrophysiology followup.  He last saw Dr. Lalla Brothers 11/2021 where ablation was discussed though deferred d/t patient's chronic diabetic wounds. He saw PA Keitha Butte 05/2022 where wounds had all closed except 1 small, planning to have appt with Dr. Lalla Brothers to discuss.  On follow-up today, *** AF burden, symptoms *** palpitations *** bleeding concerns  - PCP thyroid plan?   Since last being seen in our clinic the patient reports doing ***.  he denies chest pain, palpitations, dyspnea, PND, orthopnea, nausea, vomiting, dizziness, syncope, edema, weight gain, or early satiety.      AAD History: Amiodarone - current Tikosyn - failed during initiation     ROS:  Please see the history of present illness. All other systems are reviewed and otherwise negative.    Physical Exam    VS:  There were no vitals taken for this visit. BMI: There is no height or weight on file to calculate BMI.  Wt Readings from Last 3 Encounters:  06/12/22 291 lb 12.8 oz (132.4 kg)  11/24/21 259 lb (117.5 kg)  08/12/21 256 lb 3.2 oz (116.2 kg)     GEN- The patient is well appearing, alert and oriented x 3 today.   Lungs- Clear to ausculation bilaterally, normal work of breathing.  Heart- {Blank single:19197::"Regular","Irregularly irregular"} rate and rhythm, no murmurs, rubs or  gallops Extremities- {EDEMA LEVEL:28147::"No"} peripheral edema, warm, dry    Studies Reviewed   Previous EP, cardiology notes.    EKG is ordered. Personal review of EKG from today shows:  ***        TTE, 07/17/2022  1. Left ventricular ejection fraction, by estimation, is 60 to 65%. The left ventricle has normal function. The left ventricle has no regional wall motion abnormalities. There is mild left ventricular hypertrophy. Left ventricular diastolic parameters are consistent with Grade I diastolic dysfunction (impaired relaxation). The average left ventricular global longitudinal strain is -16.8 %. The global longitudinal strain is abnormal.   2. Right ventricular systolic function is normal. The right ventricular size is normal. There is normal pulmonary artery systolic pressure.   3. Left atrial size was moderately dilated.   4. The mitral valve is normal in structure. Trivial mitral valve regurgitation. No evidence of mitral stenosis.   5. The aortic valve is tricuspid. Aortic valve regurgitation is not visualized. No aortic stenosis is present.   6. The inferior vena cava is normal in size with greater than 50% respiratory variability, suggesting right atrial pressure of 3 mmHg.   Comparison(s): Prior images unable to be directly viewed, comparison made by report only. The left ventricular function has improved.   TTE, 06/07/2021  1. Left ventricular ejection fraction, by estimation, is 25 to 30%. The left ventricle has severely decreased function. The left ventricle demonstrates global hypokinesis. The left ventricular internal cavity size was mildly dilated. Left ventricular diastolic parameters  are indeterminate.   2. Right ventricular systolic function is normal. The right ventricular size is normal. There is normal pulmonary artery systolic pressure.   3. Left atrial size was severely dilated.   4. Right atrial size was moderately dilated.   5. The mitral valve is normal in  structure. Mild mitral valve regurgitation. No evidence of mitral stenosis.   6. The aortic valve is tricuspid. Aortic valve regurgitation is not visualized. No aortic stenosis is present.   7. Aortic dilatation noted. There is borderline dilatation of the aortic root, measuring 39 mm.   8. The inferior vena cava is dilated in size with >50% respiratory variability, suggesting right atrial pressure of 8 mmHg.     Assessment and Plan     #) Afib #) high-risk medication use - amiodarone Maintaining sinus rhtyhm on amiodarone Recent LFTs with elevated AST and ALT (62 and 76, respectively) via [facility] labs    #) Hypercoag d/t  afib CHA2DS2-VASc Score = at least 4 [CHF History: 1, HTN History: 1, Diabetes History: 1, Stroke History: 0, Vascular Disease History: 1, Age Score: 0, Gender Score: 0].  Therefore, the patient's annual risk of stroke is 4.8 %.     {Confirm score is correct.  If not, click here to update score.  REFRESH note.  :1}   Stroke ppx - 20mg  xarelto, appropriately dosed for CrCl > 37ml/min No bleeding concerns    #) ***   {Are you ordering a CV Procedure (e.g. stress test, cath, DCCV, TEE, etc)?   Press F2        :[id number]}   Current medicines are reviewed at length with the patient today.   The patient {ACTIONS; HAS/DOES NOT HAVE:19233} concerns regarding his medicines.  The following changes were made today:  {NONE DEFAULTED:18576}  Labs/ tests ordered today include: *** No orders of the defined types were placed in this encounter.    Disposition: Follow up with {EPMDS:28135} or EP APP {EPFOLLOW UP:28173}   Signed, Sherie Don, NP  01/28/24  7:52 PM  Electrophysiology [facility]

## 2024-02-01 ENCOUNTER — Ambulatory Visit: Payer: BC Managed Care – PPO | Attending: Cardiology | Admitting: Cardiology

## 2024-02-01 ENCOUNTER — Encounter: Payer: Self-pay | Admitting: Cardiology

## 2024-02-01 VITALS — BP 122/80 | HR 86 | Ht 73.0 in | Wt 259.6 lb

## 2024-02-01 DIAGNOSIS — D6869 Other thrombophilia: Secondary | ICD-10-CM | POA: Diagnosis not present

## 2024-02-01 DIAGNOSIS — I4819 Other persistent atrial fibrillation: Secondary | ICD-10-CM | POA: Diagnosis not present

## 2024-02-01 DIAGNOSIS — Z5181 Encounter for therapeutic drug level monitoring: Secondary | ICD-10-CM | POA: Diagnosis not present

## 2024-02-01 DIAGNOSIS — Z79899 Other long term (current) drug therapy: Secondary | ICD-10-CM | POA: Diagnosis not present

## 2024-02-01 MED ORDER — AMIODARONE HCL 100 MG PO TABS: 100.0000 mg | ORAL_TABLET | Freq: Every day | ORAL | 3 refills | Status: AC

## 2024-02-01 NOTE — Patient Instructions (Addendum)
Medication Instructions:  Decrease Amiodarone to 100 mg daily   *If you need a refill on your cardiac medications before your next appointment, please call your pharmacy*  Test: We will call you for ablation date and time-   Follow-Up: At [facility], you and your health needs are our priority.  As part of our continuing mission to provide you with exceptional heart care, we have created designated Provider Care Teams.  These Care Teams include your primary Cardiologist (physician) and Advanced Practice Providers (APPs -  Physician Assistants and Nurse Practitioners) who all work together to provide you with the care you need, when you need it.  We recommend signing up for the patient portal called "MyChart".  Sign up information is provided on this After Visit Summary.  MyChart is used to connect with patients for Virtual Visits (Telemedicine).  Patients are able to view lab/test results, encounter notes, upcoming appointments, etc.  Non-urgent messages can be sent to your provider as well.   To learn more about what you can do with MyChart, go to [email or web address].    Your next appointment:   We will call you for follow up appointment.     Please see primary care provider in regards to your thyroid.

## 2024-03-18 NOTE — Progress Notes (Deleted)
 Electrophysiology Office Follow up Visit Note:    Date:  03/18/2024   ID:  [name], DOB 1961-08-30, MRN [id number]  PCP:  Laurine Blazer, MD  [facility] Cardiologist:  Marcina Millard, MD  [facility] Electrophysiologist:  Lanier Prude, MD    Interval History:     [name] is a 63 y.o. male who presents for a follow up visit.   The patient last saw Rosalita Chessman February 01, 2024.  The patient has a history of atrial fibrillation, coronary disease with prior PCI, COPD, tobacco use, hypertension, systolic heart failure and lower extremity wounds.  Catheter ablation was discussed in the past but deferred because of chronic diabetic foot wounds.  At the appointment with Rosalita Chessman he was doing well without recurrence of atrial fibrillation on amiodarone.  At that appointment he reported his wounds have completely healed.  Recent blood work showed abnormal liver and thyroid function.        Past medical, surgical, social and family history were reviewed.  ROS:   Please see the history of present illness.    All other systems reviewed and are negative.  EKGs/Labs/Other Studies Reviewed:    The following studies were reviewed today:  July 17, 2022 echo EF 60-65 RV normal Moderately dilated left atrium Trivial MR  February 01, 2024 EKG shows sinus rhythm  June 12, 2022 lab work showed an AST of 44, ALT of 50, TSH of 3.7        Physical Exam:    VS:  There were no vitals taken for this visit.    Wt Readings from Last 3 Encounters:  02/01/24 259 lb 9.6 oz (117.8 kg)  06/12/22 291 lb 12.8 oz (132.4 kg)  11/24/21 259 lb (117.5 kg)     GEN: no distress CARD: RRR, No MRG RESP: No IWOB. CTAB.      ASSESSMENT:    1. Persistent atrial fibrillation (HCC)   2. Chronic systolic heart failure (HCC)   3. Encounter for long-term (current) use of high-risk medication    PLAN:    In order of problems listed above:  #Persistent atrial  fibrillation #High risk med monitoring-amiodarone The patient has a history of persistent atrial fibrillation associated with systolic heart failure.  He has previously been managed with amiodarone but is experiencing abnormal thyroid and liver function.  Given his young age, indefinite exposure to amiodarone is not ideal.  We discussed treatment strategies for atrial fibrillation including catheter ablation and he is interested in proceeding.  I have discussed the catheter ablation procedure in detail including the risks, recovery and likelihood of success and he wished to proceed.  Discussed treatment options today for AF including antiarrhythmic drug therapy and ablation. Discussed risks, recovery and likelihood of success with each treatment strategy. Risk, benefits, and alternatives to EP study and ablation for afib were discussed. These risks include but are not limited to stroke, bleeding, vascular damage, tamponade, perforation, damage to the esophagus, lungs, phrenic nerve and other structures, pulmonary vein stenosis, worsening renal function, coronary vasospasm and death.  Discussed potential need for repeat ablation procedures and antiarrhythmic drugs after an initial ablation. The patient understands these risk and wishes to proceed.  We will therefore proceed with catheter ablation at the next available time.  Carto, ICE, anesthesia are requested for the procedure.  Will also obtain CT PV protocol prior to the procedure to exclude LAA thrombus and further evaluate atrial anatomy.  Continue Xarelto for stroke prophylaxis  #Chronic systolic  heart failure NYHA class II.  Rhythm control indicated.  EF previously down to 25 to 30%, now recovered.  Continue Entresto, metoprolol, Jardiance.   Signed, Steffanie Dunn, MD, [facility], [facility] 03/18/2024 3:22 PM    Electrophysiology [facility]

## 2024-03-19 ENCOUNTER — Ambulatory Visit: Attending: Cardiology | Admitting: Cardiology

## 2024-03-19 DIAGNOSIS — I4819 Other persistent atrial fibrillation: Secondary | ICD-10-CM

## 2024-03-19 DIAGNOSIS — Z79899 Other long term (current) drug therapy: Secondary | ICD-10-CM

## 2024-03-19 DIAGNOSIS — I5022 Chronic systolic (congestive) heart failure: Secondary | ICD-10-CM
